# Patient Record
Sex: Female | Born: 1955 | Race: White | Hispanic: No | Marital: Married | Smoking: Never smoker
Health system: Southern US, Community
[De-identification: ages and names within clinical notes are randomized; demographics above are authoritative.]

## PROBLEM LIST (undated history)

## (undated) DIAGNOSIS — M542 Cervicalgia: Secondary | ICD-10-CM

## (undated) DIAGNOSIS — F32A Depression, unspecified: Secondary | ICD-10-CM

## (undated) DIAGNOSIS — M4800 Spinal stenosis, site unspecified: Secondary | ICD-10-CM

## (undated) DIAGNOSIS — R87629 Unspecified abnormal cytological findings in specimens from vagina: Secondary | ICD-10-CM

## (undated) DIAGNOSIS — G8929 Other chronic pain: Secondary | ICD-10-CM

## (undated) DIAGNOSIS — N200 Calculus of kidney: Secondary | ICD-10-CM

## (undated) DIAGNOSIS — K635 Polyp of colon: Secondary | ICD-10-CM

## (undated) DIAGNOSIS — M810 Age-related osteoporosis without current pathological fracture: Secondary | ICD-10-CM

## (undated) DIAGNOSIS — I5189 Other ill-defined heart diseases: Secondary | ICD-10-CM

## (undated) DIAGNOSIS — K219 Gastro-esophageal reflux disease without esophagitis: Secondary | ICD-10-CM

## (undated) DIAGNOSIS — I10 Essential (primary) hypertension: Secondary | ICD-10-CM

## (undated) DIAGNOSIS — R0600 Dyspnea, unspecified: Secondary | ICD-10-CM

## (undated) DIAGNOSIS — G43909 Migraine, unspecified, not intractable, without status migrainosus: Secondary | ICD-10-CM

## (undated) DIAGNOSIS — T7840XA Allergy, unspecified, initial encounter: Secondary | ICD-10-CM

## (undated) HISTORY — DX: Migraine, unspecified, not intractable, without status migrainosus: G43.909

## (undated) HISTORY — PX: BACK SURGERY: SHX140

## (undated) HISTORY — DX: Calculus of kidney: N20.0

## (undated) HISTORY — DX: Other chronic pain: G89.29

## (undated) HISTORY — PX: VAGINAL HYSTERECTOMY: SUR661

## (undated) HISTORY — DX: Spinal stenosis, site unspecified: M48.00

## (undated) HISTORY — DX: Polyp of colon: K63.5

## (undated) HISTORY — DX: Unspecified abnormal cytological findings in specimens from vagina: R87.629

## (undated) HISTORY — DX: Allergy, unspecified, initial encounter: T78.40XA

## (undated) HISTORY — DX: Cervicalgia: M54.2

## (undated) HISTORY — DX: Age-related osteoporosis without current pathological fracture: M81.0

---

## 2009-09-17 ENCOUNTER — Ambulatory Visit: Payer: Self-pay | Admitting: General Practice

## 2012-06-15 ENCOUNTER — Ambulatory Visit: Payer: Self-pay | Admitting: General Practice

## 2012-12-06 ENCOUNTER — Ambulatory Visit: Payer: Self-pay | Admitting: General Practice

## 2012-12-26 ENCOUNTER — Ambulatory Visit: Payer: Self-pay | Admitting: General Practice

## 2013-07-21 DIAGNOSIS — G5601 Carpal tunnel syndrome, right upper limb: Secondary | ICD-10-CM | POA: Insufficient documentation

## 2014-03-07 ENCOUNTER — Ambulatory Visit: Payer: Self-pay

## 2014-09-06 DIAGNOSIS — M542 Cervicalgia: Secondary | ICD-10-CM | POA: Insufficient documentation

## 2014-09-06 DIAGNOSIS — R202 Paresthesia of skin: Secondary | ICD-10-CM | POA: Insufficient documentation

## 2014-10-17 ENCOUNTER — Encounter: Payer: Self-pay | Admitting: Family Medicine

## 2014-10-17 ENCOUNTER — Ambulatory Visit (INDEPENDENT_AMBULATORY_CARE_PROVIDER_SITE_OTHER): Payer: BLUE CROSS/BLUE SHIELD | Admitting: Family Medicine

## 2014-10-17 VITALS — BP 129/75 | HR 82 | Resp 16 | Ht 64.0 in | Wt 218.6 lb

## 2014-10-17 DIAGNOSIS — M7989 Other specified soft tissue disorders: Secondary | ICD-10-CM | POA: Insufficient documentation

## 2014-10-17 DIAGNOSIS — R202 Paresthesia of skin: Secondary | ICD-10-CM | POA: Diagnosis not present

## 2014-10-17 DIAGNOSIS — B354 Tinea corporis: Secondary | ICD-10-CM | POA: Diagnosis not present

## 2014-10-17 DIAGNOSIS — N951 Menopausal and female climacteric states: Secondary | ICD-10-CM | POA: Insufficient documentation

## 2014-10-17 MED ORDER — TERBINAFINE HCL 1 % EX CREA: 1.0000 "application " | TOPICAL_CREAM | Freq: Two times a day (BID) | CUTANEOUS | Status: DC

## 2014-10-17 MED ORDER — VENLAFAXINE HCL ER 75 MG PO CP24
75.0000 mg | ORAL_CAPSULE | Freq: Every day | ORAL | Status: DC
Start: 2014-10-17 — End: 2015-02-05

## 2014-10-17 NOTE — Patient Instructions (Signed)
Prop legs as possible.  Recommend support socks.

## 2014-10-17 NOTE — Progress Notes (Signed)
Name: Robin Ochoa   MRN: [id number]    DOB: 05-Nov-1955   Date:10/17/2014       Progress Note  Subjective  Chief Complaint  Chief Complaint  Patient presents with  . Joint Swelling    Left ankle worse than right.     HPI  Hx. Of bilateral leg swelling for years.  L>R.  Somewhat worse past 2 mos.  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   Menopausal.  Robin Ochoa for "hot flashes"   Past Medical History  Diagnosis Date  . Neck pain, chronic     History  Substance Use Topics  . Smoking status: Never Smoker   . Smokeless tobacco: Never Used  . Alcohol Use: 0.6 oz/week    1 Cans of beer per week     Current outpatient prescriptions:  .  aspirin EC 81 MG tablet, Take 81 mg by mouth., Disp: , Rfl:  .  cyclobenzaprine (FLEXERIL) 10 MG tablet, Take 10 mg by mouth., Disp: , Rfl:  .  estrogens, conjugated, (PREMARIN) 0.3 MG tablet, Take 0.3 mg by mouth., Disp: , Rfl:  .  gabapentin (NEURONTIN) 100 MG capsule, Take 100 mg by mouth., Disp: , Rfl:  .  hydrochlorothiazide (HYDRODIURIL) 25 MG tablet, Take 25 mg by mouth., Disp: , Rfl:  .  Multiple Vitamins-Minerals (CENTRUM ADULTS PO), Take by mouth., Disp: , Rfl:  .  Venlafaxine HCl 150 MG TB24, Take 150 mg by mouth., Disp: , Rfl:   Allergies  Allergen Reactions  . Ibuprofen Other (See Comments)    Causes heartburn, burning in her throat.  Marland Kitchen Penicillins Rash    Review of Systems  Constitutional: Negative.  Negative for fever, chills and malaise/fatigue.  HENT: Negative.  Negative for nosebleeds.   Eyes: Negative.  Negative for blurred vision and double vision.  Respiratory: Negative.  Negative for cough, sputum production, shortness of breath and wheezing.   Cardiovascular: Negative for chest pain, palpitations, orthopnea and claudication. Leg swelling: bilateral, but L>R.  Gastrointestinal: Negative.  Negative for heartburn, nausea, vomiting, abdominal pain, diarrhea, constipation and blood in stool.  Genitourinary:  Negative.  Negative for dysuria, urgency, frequency and hematuria.  Musculoskeletal: Positive for back pain (with L. sciatica.).  Skin: Negative.  Negative for rash.  Neurological: Negative.  Negative for dizziness, tingling, sensory change, focal weakness, weakness and headaches.  Psychiatric/Behavioral: Negative.  Negative for depression. The patient is not nervous/anxious.       Objective  Filed Vitals:   10/17/14 1612  BP: 129/75  Pulse: 82  Resp: 16  Height: 5\' 4"  (1.626 m)  Weight: 218 lb 9.6 oz (99.156 kg)     Physical Exam  Constitutional: She is oriented to person, place, and time and well-developed, well-nourished, and in no distress. No distress.  HENT:  Head: Normocephalic and atraumatic.  Neck: Normal range of motion. Neck supple. No thyromegaly present.  Cardiovascular: Normal rate, regular rhythm, normal heart sounds and intact distal pulses.  Exam reveals no gallop and no friction rub.   No murmur heard. Pulmonary/Chest: Effort normal and breath sounds normal. No respiratory distress. She has no wheezes. She has no rales.  Abdominal: Soft. Bowel sounds are normal. She exhibits no mass. There is no tenderness.  Musculoskeletal: She exhibits edema (bilateral pedal edema with L>R.).  Lymphadenopathy:    She has no cervical adenopathy.  Neurological: She is alert and oriented to person, place, and time.  Skin: Skin is warm and dry.  Vitals reviewed.  Assessment & Plan    .1. Leg swelling  - Ambulatory referral to Vascular Surgery  2. Leg paresthesia -Continue current meds.  3. Tinea corporis  - terbinafine (LAMISIL AT) 1 % cream; Apply 1 application topically 2 (two) times daily.  Dispense: 30 g; Refill: 0  4. Menopausal hot flushes  - venlafaxine XR (EFFEXOR XR) 75 MG 24 hr capsule; Take 1 capsule (75 mg total) by mouth daily with breakfast.  Dispense: 30 capsule; Refill: 6

## 2014-10-19 ENCOUNTER — Telehealth: Payer: Self-pay | Admitting: Family Medicine

## 2014-10-19 NOTE — Telephone Encounter (Signed)
In referral notes.

## 2014-10-19 NOTE — Telephone Encounter (Signed)
Pt states a referral was to be made for her and she wanted the CMA to know she will be out of town June 27-29  Monday-Wednesday.

## 2014-10-31 ENCOUNTER — Telehealth: Payer: Self-pay | Admitting: Family Medicine

## 2014-10-31 NOTE — Telephone Encounter (Signed)
Pt called wanted to know  If  Referral  To a vascular  Dr.pt call back # is  [phone].

## 2014-11-01 NOTE — Telephone Encounter (Signed)
Faxed to [place] at [place] and she will contact patient.[place] patient she will hear from them. [facility]

## 2014-12-03 ENCOUNTER — Ambulatory Visit: Payer: BLUE CROSS/BLUE SHIELD | Attending: Neurology

## 2014-12-03 DIAGNOSIS — G4733 Obstructive sleep apnea (adult) (pediatric): Secondary | ICD-10-CM | POA: Insufficient documentation

## 2014-12-03 DIAGNOSIS — G4761 Periodic limb movement disorder: Secondary | ICD-10-CM | POA: Diagnosis not present

## 2015-02-05 ENCOUNTER — Ambulatory Visit (INDEPENDENT_AMBULATORY_CARE_PROVIDER_SITE_OTHER): Payer: BLUE CROSS/BLUE SHIELD | Admitting: Family Medicine

## 2015-02-05 ENCOUNTER — Encounter: Payer: Self-pay | Admitting: Family Medicine

## 2015-02-05 VITALS — BP 127/74 | HR 84 | Resp 16 | Ht 64.0 in | Wt 217.8 lb

## 2015-02-05 DIAGNOSIS — Z23 Encounter for immunization: Secondary | ICD-10-CM | POA: Diagnosis not present

## 2015-02-05 DIAGNOSIS — M25561 Pain in right knee: Secondary | ICD-10-CM | POA: Diagnosis not present

## 2015-02-05 DIAGNOSIS — M25569 Pain in unspecified knee: Secondary | ICD-10-CM | POA: Insufficient documentation

## 2015-02-05 DIAGNOSIS — M7989 Other specified soft tissue disorders: Secondary | ICD-10-CM

## 2015-02-05 DIAGNOSIS — B354 Tinea corporis: Secondary | ICD-10-CM | POA: Diagnosis not present

## 2015-02-05 DIAGNOSIS — N951 Menopausal and female climacteric states: Secondary | ICD-10-CM | POA: Diagnosis not present

## 2015-02-05 MED ORDER — KETOCONAZOLE 2 % EX CREA: 1.0000 "application " | TOPICAL_CREAM | Freq: Every day | CUTANEOUS | Status: DC

## 2015-02-05 MED ORDER — MELOXICAM 15 MG PO TABS: 15.0000 mg | ORAL_TABLET | Freq: Every day | ORAL | Status: DC

## 2015-02-05 MED ORDER — GABAPENTIN 100 MG PO CAPS: ORAL_CAPSULE | ORAL | Status: DC

## 2015-02-05 MED ORDER — VENLAFAXINE HCL 75 MG PO TABS: ORAL_TABLET | ORAL | Status: DC

## 2015-02-05 NOTE — Progress Notes (Signed)
Name: Robin Ochoa   MRN: [id number]    DOB: 07/29/1955   Date:02/05/2015       Progress Note  Subjective  Chief Complaint  Chief Complaint  Patient presents with  . Leg Swelling  . Knee Pain    Right Knee pain after turning wrong about a week ago.     HPI  Here for f/u of pedal edema. Vascular evaluation just showed slow vascular return.  Suggesed support socks as I did.  R. Knee pain after minor twist. R knee with swelling, popping, and pain now, with exercise.  Resting it helps.  Feels like it will give way and hurts more.     Also tinea corporis of L. Arm not healed yet. No problem-specific assessment & plan notes found for this encounter.   Past Medical History  Diagnosis Date  . Neck pain, chronic     Social History  Substance Use Topics  . Smoking status: Never Smoker   . Smokeless tobacco: Never Used  . Alcohol Use: 0.6 oz/week    1 Cans of beer per week     Current outpatient prescriptions:  .  aspirin EC 81 MG tablet, Take 81 mg by mouth., Disp: , Rfl:  .  cyclobenzaprine (FLEXERIL) 10 MG tablet, TAKE (1) TABLET THREE TIMES DAILY AS NEEDED FOR MUSCLE SPASM., Disp: , Rfl:  .  gabapentin (NEURONTIN) 100 MG capsule, Take 100 mg by mouth., Disp: , Rfl:  .  Multiple Vitamins-Minerals (CENTRUM ADULTS PO), Take by mouth., Disp: , Rfl:  .  terbinafine (LAMISIL) 1 % cream, Apply topically., Disp: , Rfl:  .  venlafaxine (EFFEXOR) 75 MG tablet, Take 75 mg by mouth., Disp: , Rfl:  .  estrogens, conjugated, (PREMARIN) 0.3 MG tablet, Take 0.3 mg by mouth., Disp: , Rfl:  .  gabapentin (NEURONTIN) 100 MG capsule, Take 100 mg by mouth., Disp: , Rfl:   Allergies  Allergen Reactions  . Ibuprofen Other (See Comments)    Causes heartburn, burning in her throat.  Marland Kitchen Penicillins Rash    Review of Systems  Constitutional: Negative for fever, chills, weight loss and malaise/fatigue.  HENT: Negative for hearing loss.   Eyes: Negative for blurred vision and double vision.   Respiratory: Negative for cough, hemoptysis, shortness of breath and wheezing.   Cardiovascular: Negative for chest pain, palpitations, orthopnea and leg swelling.  Gastrointestinal: Negative for heartburn, abdominal pain and blood in stool.  Genitourinary: Negative for dysuria, urgency and frequency.  Skin: Positive for rash.  Neurological: Negative for dizziness, tremors, weakness and headaches.      Objective  Filed Vitals:   02/05/15 1607  BP: 127/74  Pulse: 84  Resp: 16  Height: 5\' 4"  (1.626 m)  Weight: 217 lb 12.8 oz (98.793 kg)     Physical Exam  Constitutional: She is well-developed, well-nourished, and in no distress. No distress.  HENT:  Head: Normocephalic and atraumatic.  Neck: Normal range of motion. Neck supple. Carotid bruit is not present. No thyromegaly present.  Cardiovascular: Normal rate, regular rhythm, normal heart sounds and intact distal pulses.  Exam reveals no gallop and no friction rub.   No murmur heard. Pulmonary/Chest: Effort normal and breath sounds normal. No respiratory distress. She has no wheezes. She has no rales.  Musculoskeletal: She exhibits no edema (trace bilateral pedal edema.).  R knee with pain with flexion against weight.  Knee pops.  Minimal swelling of R knee.  Lymphadenopathy:    She has no cervical adenopathy.  Skin:  Linear tio sl. Circular skin lesion of L inner upper arm.  Not badly inflamed at present.  Vitals reviewed.     No results found for this or any previous visit (from the past 2160 hour(s)).   Assessment & Plan  1. Knee pain, acute, right  - meloxicam (MOBIC) 15 MG tablet; Take 1 tablet (15 mg total) by mouth daily.  Dispense: 30 tablet; Refill: 2 - gabapentin (NEURONTIN) 100 MG capsule; Take 2 capsules twice daily  Dispense: 120 capsule; Refill: 6  2. Menopausal hot flushes  - venlafaxine (EFFEXOR) 75 MG tablet; Take 1 capsule daily  Dispense: 30 tablet; Refill: 6  3. Leg swelling   4. Tinea  corporis  - ketoconazole (NIZORAL) 2 % cream; Apply 1 application topically daily.  Dispense: 30 g; Refill: 3  5. Need for influenza vaccination  - Flu Vaccine QUAD 36+ mos PF IM (Fluarix & Fluzone Quad PF)

## 2015-05-23 ENCOUNTER — Other Ambulatory Visit: Payer: Self-pay | Admitting: Family Medicine

## 2015-06-11 ENCOUNTER — Ambulatory Visit: Payer: BLUE CROSS/BLUE SHIELD | Admitting: Family Medicine

## 2015-06-22 ENCOUNTER — Other Ambulatory Visit: Payer: Self-pay | Admitting: Family Medicine

## 2015-07-04 ENCOUNTER — Encounter: Payer: Self-pay | Admitting: Cardiovascular Disease

## 2015-07-04 ENCOUNTER — Ambulatory Visit (INDEPENDENT_AMBULATORY_CARE_PROVIDER_SITE_OTHER): Payer: BLUE CROSS/BLUE SHIELD | Admitting: Cardiovascular Disease

## 2015-07-04 VITALS — BP 110/70 | HR 81 | Ht 64.0 in | Wt 217.1 lb

## 2015-07-04 DIAGNOSIS — I059 Rheumatic mitral valve disease, unspecified: Secondary | ICD-10-CM | POA: Insufficient documentation

## 2015-07-04 DIAGNOSIS — M7989 Other specified soft tissue disorders: Secondary | ICD-10-CM

## 2015-07-04 DIAGNOSIS — Z7189 Other specified counseling: Secondary | ICD-10-CM | POA: Diagnosis not present

## 2015-07-04 DIAGNOSIS — Z7689 Persons encountering health services in other specified circumstances: Secondary | ICD-10-CM

## 2015-07-04 MED ORDER — HYDROCHLOROTHIAZIDE 25 MG PO TABS: 25.0000 mg | ORAL_TABLET | Freq: Every day | ORAL | Status: DC | PRN

## 2015-07-04 NOTE — Patient Instructions (Signed)
You are doing well.  Please take HCTZ as needed with potassium for hand and ankle swelling  Leg swelling likely from venous insufficency, Consider compression hose   Watch the breads and carbs  No murmur appreciated, No need for echocardiogram at this time  Research CT coronary calcium score , to look for blockage $150  Please call us if you have new issues that need to be addressed before your next appt.

## 2015-07-04 NOTE — Assessment & Plan Note (Signed)
We have encouraged continued exercise, careful diet management in an effort to lose weight. 

## 2015-07-04 NOTE — Progress Notes (Signed)
Patient ID: Robin Ochoa, female    DOB: [birth year], 60 y.o.   MRN: [id number]  HPI Comments: [name] is a pleasant 60 year old woman with history of obesity, broken pelvis in 2014, previous echocardiogram in 2014 suggesting moderate mitral valve regurgitation with normal ejection fraction (read by Dr. Louisa Second), who presents for consultation for evaluation of her mitral valve disease and lower extremity edema. Patient of Dr. Luan Pulling  She reports a long-standing history of lower extremity edema Was on HCTZ for many years through age 5 and 60, was taken off the diuretic more recently out of concern for prolonged use of the diuretic and renal dysfunction. Since she stopped the HCTZ, she does report occasional days when she has severe swelling in her hands, sometimes in her ankles. Typically happens after she eats a high salt meal with lots of fluids. 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   She reports that she had chest pain approximately 12 years ago and at that time had stress test and echocardiogram which were reportedly normal. Seen by cardiology at the time.  Suffered a fall in 2014, broke her pelvis  Echocardiogram in 2014 with normal ejection fraction, mild TR, moderate MR (read by outside physician)  Family history; mother died at age 79, father died age 40 from stomach cancer  EKG on today's visit shows normal sinus rhythm with rate 77 bpm, nonspecific T-wave abnormality     Allergies  Allergen Reactions  . Ibuprofen Other (See Comments)    Causes heartburn, burning in her throat.  Marland Kitchen Penicillins Rash    Current Outpatient Prescriptions on File Prior to Visit  Medication Sig Dispense Refill  . aspirin EC 81 MG tablet Take 81 mg by mouth.    . cyclobenzaprine (FLEXERIL) 10 MG tablet TAKE (1) TABLET THREE TIMES DAILY AS NEEDED FOR MUSCLE SPASM.    Marland Kitchen ketoconazole (NIZORAL) 2 % cream Apply 1  application topically daily. 30 g 3  . meloxicam (MOBIC) 15 MG tablet Take 1 tablet (15 mg total) by mouth daily. 30 tablet 0  . Multiple Vitamins-Minerals (CENTRUM ADULTS PO) Take by mouth.    . venlafaxine (EFFEXOR) 75 MG tablet Take 1 capsule daily 30 tablet 6   No current facility-administered medications on file prior to visit.    Past Medical History  Diagnosis Date  . Neck pain, chronic     Past Surgical History  Procedure Laterality Date  . Vaginal hysterectomy  60 years old    Tumors-     Social History  reports that she has never smoked. She has never used smokeless tobacco. She reports that she drinks about 0.6 oz of alcohol per week. She reports that she does not use illicit drugs.  Family History family history includes Cancer in her father; Heart failure in her mother.    Review of Systems  Constitutional: Negative.   Respiratory: Negative.   Cardiovascular: Negative.   Gastrointestinal: Negative.   Musculoskeletal: Negative.   Neurological: Negative.   Hematological: Negative.   Psychiatric/Behavioral: Negative.     BP 110/70 mmHg  Pulse 81  Ht 5\' 4"  (1.626 m)  Wt 217 lb 1.9 oz (98.485 kg)  BMI 37.25 kg/m2  SpO2 95%  LMP  (Approximate)   Physical Exam  Constitutional: She is oriented to person, place, and time. She appears well-developed and well-nourished.  Obese  HENT:  Head: Normocephalic.  Nose: Nose normal.  Mouth/Throat: Oropharynx is clear and moist.  Eyes: Conjunctivae are normal. Pupils are equal, round, and reactive to light.  Neck: Normal range of motion. Neck supple. No JVD present.  Cardiovascular: Normal rate, regular rhythm, normal heart sounds and intact distal pulses.  Exam reveals no gallop and no friction rub.   No murmur heard. Pulmonary/Chest: Effort normal and breath sounds normal. No respiratory distress. She has no wheezes. She has no rales. She exhibits no tenderness.  Abdominal: Soft. Bowel sounds are normal. She  exhibits no distension. There is no tenderness.  Musculoskeletal: Normal range of motion. She exhibits no edema or tenderness.  Lymphadenopathy:    She has no cervical adenopathy.  Neurological: She is alert and oriented to person, place, and time. Coordination normal.  Skin: Skin is warm and dry. No rash noted. No erythema.  Psychiatric: She has a normal mood and affect. Her behavior is normal. Judgment and thought content normal.

## 2015-07-04 NOTE — Assessment & Plan Note (Signed)
Previous echocardiogram in 2014 suggesting moderate MR. No significant murmur appreciated on exam today on thorough auscultation. I suspect echocardiogram was an over call as no murmur appreciated. As she has no clinical symptoms concerning for significant mitral valve regurgitation, would not recommend echocardiogram at this time. Certainly if she is concerned about MR or develops clinical signs of fluid overload, could order an echo. She is happy to do no further workup at this time

## 2015-07-04 NOTE — Assessment & Plan Note (Signed)
Leg swelling likely secondary to venous insufficiency, exacerbated by her weight. Recommended compression hose, leg elevation. Recommended that she avoid excessive salt. Previously on HCTZ. She does report since stopping HCTZ she does have rare occasions of severe hand swelling, ankle swelling after salt loading. Recommended she take HCTZ only when necessary for severe hand or ankle swelling but not on a regular basis. Suggested she take this with low-dose potassium, over-the-counter

## 2015-07-25 ENCOUNTER — Other Ambulatory Visit: Payer: Self-pay | Admitting: Family Medicine

## 2015-08-22 DIAGNOSIS — B372 Candidiasis of skin and nail: Secondary | ICD-10-CM | POA: Diagnosis not present

## 2015-08-22 DIAGNOSIS — G4733 Obstructive sleep apnea (adult) (pediatric): Secondary | ICD-10-CM | POA: Diagnosis not present

## 2015-08-22 DIAGNOSIS — L920 Granuloma annulare: Secondary | ICD-10-CM | POA: Diagnosis not present

## 2015-10-01 DIAGNOSIS — Z0000 Encounter for general adult medical examination without abnormal findings: Secondary | ICD-10-CM | POA: Diagnosis not present

## 2015-10-01 DIAGNOSIS — N879 Dysplasia of cervix uteri, unspecified: Secondary | ICD-10-CM | POA: Diagnosis not present

## 2015-10-01 DIAGNOSIS — Z780 Asymptomatic menopausal state: Secondary | ICD-10-CM | POA: Diagnosis not present

## 2015-10-01 DIAGNOSIS — Z6836 Body mass index (BMI) 36.0-36.9, adult: Secondary | ICD-10-CM | POA: Diagnosis not present

## 2015-10-18 ENCOUNTER — Other Ambulatory Visit: Payer: Self-pay | Admitting: Family Medicine

## 2015-10-18 DIAGNOSIS — N951 Menopausal and female climacteric states: Secondary | ICD-10-CM

## 2015-10-21 MED ORDER — VENLAFAXINE HCL 75 MG PO TABS: ORAL_TABLET | ORAL | Status: DC

## 2015-11-20 ENCOUNTER — Ambulatory Visit (INDEPENDENT_AMBULATORY_CARE_PROVIDER_SITE_OTHER): Payer: BLUE CROSS/BLUE SHIELD | Admitting: Family Medicine

## 2015-11-20 ENCOUNTER — Telehealth: Payer: Self-pay | Admitting: Family Medicine

## 2015-11-20 ENCOUNTER — Encounter: Payer: Self-pay | Admitting: Family Medicine

## 2015-11-20 VITALS — BP 131/66 | HR 73 | Temp 98.5°F | Resp 16 | Ht 64.0 in | Wt 216.0 lb

## 2015-11-20 DIAGNOSIS — M25561 Pain in right knee: Secondary | ICD-10-CM

## 2015-11-20 DIAGNOSIS — Z23 Encounter for immunization: Secondary | ICD-10-CM | POA: Diagnosis not present

## 2015-11-20 DIAGNOSIS — Z8249 Family history of ischemic heart disease and other diseases of the circulatory system: Secondary | ICD-10-CM | POA: Diagnosis not present

## 2015-11-20 DIAGNOSIS — N951 Menopausal and female climacteric states: Secondary | ICD-10-CM

## 2015-11-20 DIAGNOSIS — R635 Abnormal weight gain: Secondary | ICD-10-CM | POA: Diagnosis not present

## 2015-11-20 MED ORDER — VENLAFAXINE HCL 75 MG PO TABS: ORAL_TABLET | ORAL | 11 refills | Status: DC

## 2015-11-20 MED ORDER — GABAPENTIN 100 MG PO CAPS: 200.0000 mg | ORAL_CAPSULE | Freq: Every day | ORAL | 11 refills | Status: DC

## 2015-11-20 MED ORDER — MELOXICAM 7.5 MG PO TABS: ORAL_TABLET | ORAL | 5 refills | Status: DC

## 2015-11-20 NOTE — Assessment & Plan Note (Signed)
Renewed gabapentin and effexor. Recommend taking it at night-time to help with symptoms. Recheck 6 mos.

## 2015-11-20 NOTE — Progress Notes (Signed)
Subjective:    Patient ID: Robin Ochoa, female    DOB: [birth year], 60 y.o.   MRN: [id number]  HPI: Robin Ochoa is a 60 y.o. female presenting on 11/20/2015 for Medication Refill   HPI  Pt presents for medication refill.  Needs refill for  Gabapentin and Effexor for hot flashes. Taking 2 capsules of gabapentin in the AM. Taking effexor 75mg  once daily. Pt also requesting refill on hydroxyzine for occasional migraines. 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 Previously seeing HA specialist in [place]. Triggered by the heat. Last migraine was last weekend. No focal neurological symptoms.  Pt is also requesting a refill of meloxicam for joint pain.  She would also like lab-work. She is concerned about weight gain. Having trouble losing weight. Is trying to cut out sodas and juice.  Would like the shingles vaccine.   Past Medical History:  Diagnosis Date  . Migraine   . Neck pain, chronic     Current Outpatient Prescriptions on File Prior to Visit  Medication Sig  . aspirin EC 81 MG tablet Take 81 mg by mouth.  . cyclobenzaprine (FLEXERIL) 10 MG tablet TAKE (1) TABLET THREE TIMES DAILY AS NEEDED FOR MUSCLE SPASM.  Marland Kitchen hydrochlorothiazide (HYDRODIURIL) 25 MG tablet Take 1 tablet (25 mg total) by mouth daily as needed.  . hydrOXYzine (ATARAX/VISTARIL) 25 MG tablet Take 25-50 mg by mouth 2 (two) times daily as needed for anxiety (& headaches).  . Multiple Vitamins-Minerals (CENTRUM ADULTS PO) Take by mouth.  . gabapentin (NEURONTIN) 100 MG capsule Take 100 mg by mouth.   No current facility-administered medications on file prior to visit.     Review of Systems  Constitutional: Positive for unexpected weight change. Negative for chills and fever.  HENT: Negative.   Respiratory: Negative for cough, chest tightness and wheezing.   Cardiovascular: Negative for chest pain and leg swelling.  Gastrointestinal: Negative for abdominal pain, constipation, diarrhea, nausea and vomiting.  Endocrine: Negative.   Negative for cold intolerance, heat intolerance, polydipsia, polyphagia and polyuria.  Genitourinary: Negative for difficulty urinating and dysuria.  Musculoskeletal: Positive for arthralgias.  Neurological: Negative for dizziness, light-headedness and numbness.  Psychiatric/Behavioral: Negative.    Per HPI unless specifically indicated above     Objective:    BP 131/66 (BP Location: Right Arm, Patient Position: Sitting, Cuff Size: Normal)   Pulse 73   Temp 98.5 F (36.9 C) (Oral)   Resp 16   Ht 5\' 4"  (1.626 m)   Wt 216 lb (98 kg)   LMP  (Approximate)   BMI 37.08 kg/m   Wt Readings from Last 3 Encounters:  11/20/15 216 lb (98 kg)  07/04/15 217 lb 1.9 oz (98.5 kg)  02/05/15 217 lb 12.8 oz (98.8 kg)    Physical Exam  Constitutional: She is oriented to person, place, and time. She appears well-developed and well-nourished.  HENT:  Head: Normocephalic and atraumatic.  Neck: Neck supple.  Cardiovascular: Normal rate, regular rhythm and normal heart sounds.  Exam reveals no gallop and no friction rub.   No murmur heard. Pulmonary/Chest: Effort normal and breath sounds normal. She has no wheezes. She exhibits no tenderness.  Abdominal: Soft. Normal appearance and bowel sounds are normal. She exhibits no distension and no mass. There is no tenderness. There is no rebound and no guarding.  Musculoskeletal: Normal range of motion. She exhibits no edema or tenderness.  Lymphadenopathy:    She has no cervical adenopathy.  Neurological: She is alert and oriented  to person, place, and time.  Skin: Skin is warm and dry.   No results found for this or any previous visit.    Assessment & Plan:   Problem List Items Addressed This Visit      Other   Menopausal hot flushes    Renewed gabapentin and effexor. Recommend taking it at night-time to help with symptoms. Recheck 6 mos.       Relevant Medications   venlafaxine (EFFEXOR) 75 MG tablet   gabapentin (NEURONTIN) 100 MG capsule    Other Relevant Orders   COMPLETE METABOLIC PANEL WITH GFR   VITAMIN D 25 Hydroxy (Vit-D Deficiency, Fractures)   Knee pain, acute - Primary    Renewed meloxicam for joint pain. Reviewed risks of longterm NSAIDs with patient. Will try lower dose and taking PRN.  Check CMP.       Relevant Medications   meloxicam (MOBIC) 7.5 MG tablet   Morbid obesity ([place])    Reviewed healthy living tips. Encouraged eliminating sodas. Encouraged 150 minutes of exercise weekly.        Other Visit Diagnoses    Family history of heart disease       check lipid panel.    Relevant Orders   Lipid Profile   Abnormal weight gain       Chest TSH    Relevant Orders   TSH   Need for Zostavax administration       Relevant Orders   Varicella-zoster vaccine subcutaneous      Meds ordered this encounter  Medications  . venlafaxine (EFFEXOR) 75 MG tablet    Sig: Take 1 capsule daily    Dispense:  30 tablet    Refill:  11    Order Specific Question:   Supervising Provider    Answer:   Arlis Porta [phone]  . gabapentin (NEURONTIN) 100 MG capsule    Sig: Take 2 capsules (200 mg total) by mouth at bedtime.    Dispense:  60 capsule    Refill:  11    Order Specific Question:   Supervising Provider    Answer:   Arlis Porta [phone]  . meloxicam (MOBIC) 7.5 MG tablet    Sig: Take 1-2 tablets by mouth as needed for joint pain.    Dispense:  60 tablet    Refill:  5    Order Specific Question:   Supervising Provider    Answer:   Arlis Porta [id number]      Follow up plan: Return in about 6 months (around 05/22/2016), or if symptoms worsen or fail to improve.

## 2015-11-20 NOTE — Telephone Encounter (Signed)
Pt needs a refill on venlafaxine time released 75 mg sent to [facility].  Last time the time released was not sent so please make sure that is what is sent in.  Her call back number is [phone]

## 2015-11-20 NOTE — Assessment & Plan Note (Signed)
Reviewed healthy living tips. Encouraged eliminating sodas. Encouraged 150 minutes of exercise weekly.

## 2015-11-20 NOTE — Telephone Encounter (Signed)
Pt advised to schedule an appointment for med refill hasn't been seen from 01/2015.

## 2015-11-20 NOTE — Assessment & Plan Note (Signed)
Renewed meloxicam for joint pain. Reviewed risks of longterm NSAIDs with patient. Will try lower dose and taking PRN.  Check CMP.

## 2015-11-20 NOTE — Patient Instructions (Signed)
Joint pain: try taking 1 7.5mg  tablet to help with your symptoms. Can take 2 tablets daily as needed for pain.  Hot flashes- Try taking gabapentin at night-time to help with symptoms.  Weightloss: We will check some lab work. Please try to meet the goal of 150 minutes of exercise per week.  This is generally 30-40 minutes of moderate activity 3-4 times per week. Consider a calorie goal of 1800 per [place]. Eat mainly fruits, vegetables, and lean proteins (chicken or fish).  A great resource for healthy living and weight loss is Choosemyplate.gov

## 2015-11-21 LAB — COMPLETE METABOLIC PANEL WITH GFR
ALBUMIN: 4.1 g/dL (ref 3.6–5.1)
ALK PHOS: 86 U/L (ref 33–130)
ALT: 27 U/L (ref 6–29)
AST: 22 U/L (ref 10–35)
BILIRUBIN TOTAL: 0.3 mg/dL (ref 0.2–1.2)
BUN: 23 mg/dL (ref 7–25)
CALCIUM: 9.6 mg/dL (ref 8.6–10.4)
CO2: 25 mmol/L (ref 20–31)
Chloride: 104 mmol/L (ref 98–110)
Creat: 0.88 mg/dL (ref 0.50–0.99)
GFR, EST NON AFRICAN AMERICAN: 71 mL/min (ref >=60)
GFR, Est African American: 81 mL/min (ref >=60)
GLUCOSE: 116 mg/dL — AB (ref 65–99)
POTASSIUM: 3.8 mmol/L (ref 3.5–5.3)
SODIUM: 139 mmol/L (ref 135–146)
TOTAL PROTEIN: 6.8 g/dL (ref 6.1–8.1)

## 2015-11-21 LAB — LIPID PANEL
CHOLESTEROL: 197 mg/dL (ref 125–200)
HDL: 41 mg/dL — AB (ref >=46)
Total CHOL/HDL Ratio: 4.8 Ratio (ref <=5.0)
Triglycerides: 504 mg/dL — ABNORMAL HIGH (ref <150)

## 2015-11-21 LAB — VITAMIN D 25 HYDROXY (VIT D DEFICIENCY, FRACTURES): VIT D 25 HYDROXY: 21 ng/mL — AB (ref 30–100)

## 2015-11-21 LAB — TSH: TSH: 2.99 m[IU]/L

## 2015-11-26 ENCOUNTER — Other Ambulatory Visit: Payer: Self-pay | Admitting: Family Medicine

## 2015-11-26 DIAGNOSIS — E782 Mixed hyperlipidemia: Secondary | ICD-10-CM

## 2015-11-26 MED ORDER — VITAMIN D (ERGOCALCIFEROL) 1.25 MG (50000 UNIT) PO CAPS: 50000.0000 [IU] | ORAL_CAPSULE | ORAL | 1 refills | Status: DC

## 2016-01-13 DIAGNOSIS — G4733 Obstructive sleep apnea (adult) (pediatric): Secondary | ICD-10-CM | POA: Diagnosis not present

## 2016-01-22 ENCOUNTER — Other Ambulatory Visit: Payer: Self-pay | Admitting: Family Medicine

## 2016-02-06 DIAGNOSIS — Z23 Encounter for immunization: Secondary | ICD-10-CM | POA: Diagnosis not present

## 2016-03-09 ENCOUNTER — Ambulatory Visit: Payer: BLUE CROSS/BLUE SHIELD | Admitting: Family Medicine

## 2016-03-24 ENCOUNTER — Ambulatory Visit: Payer: BLUE CROSS/BLUE SHIELD | Admitting: Family Medicine

## 2016-03-26 ENCOUNTER — Ambulatory Visit (INDEPENDENT_AMBULATORY_CARE_PROVIDER_SITE_OTHER): Payer: BLUE CROSS/BLUE SHIELD | Admitting: Family Medicine

## 2016-03-26 ENCOUNTER — Ambulatory Visit
Admission: RE | Admit: 2016-03-26 | Discharge: 2016-03-26 | Disposition: A | Discharge status: Home / Self Care | Payer: BLUE CROSS/BLUE SHIELD | Source: Ambulatory Visit | Attending: Family Medicine | Admitting: Family Medicine

## 2016-03-26 ENCOUNTER — Encounter: Payer: Self-pay | Admitting: Family Medicine

## 2016-03-26 VITALS — BP 126/65 | HR 67 | Temp 98.3°F | Resp 16 | Ht 64.0 in | Wt 211.6 lb

## 2016-03-26 DIAGNOSIS — R202 Paresthesia of skin: Secondary | ICD-10-CM

## 2016-03-26 DIAGNOSIS — G8929 Other chronic pain: Secondary | ICD-10-CM | POA: Diagnosis not present

## 2016-03-26 DIAGNOSIS — R7309 Other abnormal glucose: Secondary | ICD-10-CM | POA: Diagnosis not present

## 2016-03-26 DIAGNOSIS — M5441 Lumbago with sciatica, right side: Principal | ICD-10-CM

## 2016-03-26 DIAGNOSIS — M47896 Other spondylosis, lumbar region: Secondary | ICD-10-CM | POA: Insufficient documentation

## 2016-03-26 DIAGNOSIS — M5442 Lumbago with sciatica, left side: Secondary | ICD-10-CM

## 2016-03-26 DIAGNOSIS — E782 Mixed hyperlipidemia: Secondary | ICD-10-CM

## 2016-03-26 DIAGNOSIS — M545 Low back pain: Secondary | ICD-10-CM | POA: Diagnosis not present

## 2016-03-26 NOTE — Patient Instructions (Signed)
Thank you for coming in to clinic today.  1. For your Back Pain - I think that this is due to Muscle Spasms or strain. Your Sciatic Nerve can be affected causing some of your radiation and numbness down your legs. 2. Meloxicam 15mg  daily with food 3. Start Cyclobenzapine (Flexeril) 10mg  tablets -take half to a whole up to 3 times a [place] as needed  Start Gabapentin 100mg  capsules, take at night for 2-3 nights only, and then increase to 2 times a [place] for a few days, and then may increase to 3 times a Devora, it may make you drowsy, if helps significantly at night only, then you can increase instead to 3 capsules at night, instead of 3 times a [place] - In the future if needed, we can significantly increase the dose if tolerated well, some common doses are 300mg  three times a Feick  4. May use Tylenol Extra Str 500mg  tabs - may take 1-2 tablets every 6 hours as needed  5. Recommend to start using heating pad on your lower back 1-2x daily for few weeks, can continue ice  A1c Blood test today for diabetes screening  Diet Recommendations for Low Carb   Avoid Starchy (carb) foods include: Bread, rice, pasta, potatoes, corn, crackers, bagels, muffins, all baked goods.   Protein foods include: Meat, fish, poultry, eggs, dairy foods, and beans such as pinto and kidney beans (beans also provide carbohydrate).   1. Eat at least 3 meals and 1-2 snacks per Anastos. Never go more than 4-5 hours while awake without eating.   2. Limit starchy foods to TWO per meal and ONE per snack. ONE portion of a starchy  food is equal to the following:   - ONE slice of bread (or its equivalent, such as half of a hamburger bun).   - 1/2 cup of a "scoopable" starchy food such as potatoes or rice.   - 1 OUNCE (28 grams) of starchy snacks (crackers or pretzels, look on label).   - 15 grams of carbohydrate as shown on food label.   3. Both lunch and dinner should include a protein food, a carb food, and vegetables.   - Obtain twice as  many veg's as protein or carbohydrate foods for both lunch and dinner.   - Try to keep frozen veg's on hand for a quick vegetable serving.     - Fresh or frozen veg's are best.   4. Breakfast should always include protein.    Fish oil over the counter 1g 3 times a Dalby  Vitamin D3, 2,000 units daily   This pain may take weeks to months to fully resolve, but hopefully it will respond to the medicine initially. All back injuries (small or serious) are slow to heal since we use our back muscles every Roots. Be careful with turning, twisting, lifting, sitting / standing for prolonged periods, and avoid re-injury.  If your symptoms significantly worsen with more pain, or new symptoms with weakness in one or both legs, new or different shooting leg pains, numbness in legs or groin, loss of control or retention of urine or bowel movements, please call back for advice and you may need to go directly to the Emergency Department.  Please schedule a follow-up appointment with Dr. Parks Ranger in 4 weeks for Low Back Pain, sooner if worsening  If you have any other questions or concerns, please feel free to call the clinic or send a message through [place]. You may also schedule an earlier  appointment if necessary.  [name], DO [place]

## 2016-03-26 NOTE — Progress Notes (Signed)
Subjective:    Patient ID: Robin Ochoa, female    DOB: October 03, 1955, 60 y.o.   MRN: [id number]  Robin Ochoa is a 60 y.o. female presenting on 03/26/2016 for Medication Refill (follow up)   HPI   LOW BACK PAIN, Subacute/Chronic: - Today presents with 2 months gradual worsening without improvement mid to Right low back pain, associated with shooting sharp radicular pain down back of both lower legs, left occasionally will get left leg numbness, worst with prolonged standing. Improved with sitting and resting, worse stiffness in morning. 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 Chronic history >5 yr ago had fall and fractured pelvis. - Improved with ice packs temporarily. Not tried heat - Taking Tylenol 500mg  x 2 tabs once daily (with moderate relief), Meloxicam 15mg  with food doing well, couldn't tolerate ibuprofen - No known history of lumbar OA/DJD, no prior back surgery - Admits difficulty sleeping due to pain - Denies any fevers/chills, numbness, tingling, weakness, loss of control bladder/bowel incontinence or retention, unintentional wt loss, night sweats  ABNORMAL GLUCOSE / HyperTG mixed HLD - Requests to review recent lab results from 10/2015, with abnormal non fasting glucose 116, abnormal TG up to 504, low HDL 41, and unable to calc LDL - No prior diagnosis of Pre-DM or DM. No family history of DM - Not taking any new medications for these problems, advised to follow-up re-check labs  Additional PM - C-spine DJD, was followed by [facility] in 05/2014, PM&R for Cervicalgia and some radicular symptoms, improved with chiropractor therapy, and Flexeril now out of this.  - Postmenopausal Hot flashes, currently managed on Effexor 75mg  daily and Gabapentin 100mg  once in morning. Not due for any refills today.   Social History  Substance Use Topics  . Smoking status: Never Smoker  . Smokeless tobacco: Never Used  . Alcohol  use 0.6 oz/week    1 Cans of beer per week    Review of Systems Per HPI unless specifically indicated above     Objective:    BP 126/65   Pulse 67   Temp 98.3 F (36.8 C) (Oral)   Resp 16   Ht 5\' 4"  (1.626 m)   Wt 211 lb 9.6 oz (96 kg)   LMP  (Approximate)   BMI 36.32 kg/m   Wt Readings from Last 3 Encounters:  03/26/16 211 lb 9.6 oz (96 kg)  11/20/15 216 lb (98 kg)  07/04/15 217 lb 1.9 oz (98.5 kg)    Physical Exam  Constitutional: She appears well-developed and well-nourished. No distress.  Well-appearing, comfortable, cooperative  HENT:  Head: Normocephalic and atraumatic.  Mouth/Throat: Oropharynx is clear and moist.  Cardiovascular: Normal rate and intact distal pulses.   Pulmonary/Chest: Effort normal.  Musculoskeletal: She exhibits no edema.  Low Back Inspection: Normal appearance, no spinal deformity, symmetrical. Palpation: No tenderness over spinous processes. Bilateral lumbar paraspinal muscles non-tender but localized tenderness right sided SI / piriformis region with some hypertonicity and spasm. ROM: Mostly intact full active ROM forward flex / back extension Special Testing: Seated SLR negative for radicular pain bilaterally, Right with low back pulling and tightness Strength: Bilateral hip flex/ext 5/5, knee flex/ext 5/5, ankle dorsiflex/plantarflex 5/5 Neurovascular: intact distal sensation to light touch  Neurological: She is alert. Coordination normal.  Bilateral patellar and achilles +1 DTR Distal sensation intact to light touch Normal gait  Skin: Skin is warm and dry. She is not diaphoretic.  Nursing note and vitals reviewed.    I have personally reviewed the images and agree with radiology report from Lumbar X-ray on 03/26/16.  CLINICAL DATA:  Low back pain for 2 months, no known injury, initial encounter  EXAM: LUMBAR SPINE - COMPLETE 4+ VIEW  COMPARISON:  None.  FINDINGS: Six non rib-bearing lumbar vertebra are noted. Vertebral  body height is well maintained. No pars defects are seen. Mild anterolisthesis of what appears to be L4 on L5 is noted of a degenerative nature. No soft tissue abnormality is seen.  IMPRESSION: Mild degenerative changes as described.  Electronically Signed   By: Inez Catalina M.D.   On: 03/26/2016 14:11    I have personally reviewed the following lab results from 10/2015.   Results for orders placed or performed in visit on 11/20/15  COMPLETE METABOLIC PANEL WITH GFR  Result Value Ref Range   Sodium 139 135 - 146 mmol/L   Potassium 3.8 3.5 - 5.3 mmol/L   Chloride 104 98 - 110 mmol/L   CO2 25 20 - 31 mmol/L   Glucose, Bld 116 (H) 65 - 99 mg/dL   BUN 23 7 - 25 mg/dL   Creat 0.88 0.50 - 0.99 mg/dL   Total Bilirubin 0.3 0.2 - 1.2 mg/dL   Alkaline Phosphatase 86 33 - 130 U/L   AST 22 10 - 35 U/L   ALT 27 6 - 29 U/L   Total Protein 6.8 6.1 - 8.1 g/dL   Albumin 4.1 3.6 - 5.1 g/dL   Calcium 9.6 8.6 - 10.4 mg/dL   GFR, Est African American 81 >=60 mL/min   GFR, Est Non African American 71 >=60 mL/min  Lipid Profile  Result Value Ref Range   Cholesterol 197 125 - 200 mg/dL   Triglycerides 504 (H) <150 mg/dL   HDL 41 (L) >=46 mg/dL   Total CHOL/HDL Ratio 4.8 <=5.0 Ratio   VLDL NOT CALC <30 mg/dL   LDL Cholesterol NOT CALC <130 mg/dL  VITAMIN D 25 Hydroxy (Vit-D Deficiency, Fractures)  Result Value Ref Range   Vit D, 25-Hydroxy 21 (L) 30 - 100 ng/mL  TSH  Result Value Ref Range   TSH 2.99 mIU/L      Assessment & Plan:   Problem List Items Addressed This Visit    Mixed hyperlipidemia    Significantly elevated TG >500, low HDL, unable to calc LDL This was not focus of visit today, has never taken statin - Wait on A1c results - Continue improved lifestyle modifications low carb low sugar diet, exercise, handout given - Start OTC Fish oil 1g TID for now - Follow-up 1-3 months re-evaluate, can repeat lipids 3 months see progress, discuss statin therapy      Leg  paresthesia   Chronic bilateral low back pain with bilateral sciatica - Primary    Subacute / Chronic R LBP with associated bilateral sciatica. Suspect likely due to muscle spasm/strain without known injury or trauma, without prior DJD or chronic back pain. No prior surgery - No red flag symptoms. Negative SLR for radiculopathy but concern with reported paresthesias and sciatica symptoms  Plan: 1. Lumbar X-ray today - reviewed results and images, mild DJD changes with L4 on L5 anterolisthesis. No acute findings. 2. Continue Meloxicam 15mg  daily 3. Continue Flexeril, request refill when need, 5-10mg  TID PRN 4. Increase gabapentin to 100mg  TID slow titration caution sedation, may continue increase up towards 200 to 300mg  TID if tolerated and helping a lot, notify us for refill  or higher dose if needed 5. Considered prednisone burst but hold given abnormal glucose 6. May use Tylenol PRN for breakthrough 7. Encouraged use of heating pad 1-2x daily for now then PRN 8. RTC 1 month, re-evaluation. Consider PT trial. Return criteria given if acute worsening when to go to ED, and consider patient establish back with her [facility] / PM&R or chiropractor       Relevant Orders   DG Lumbar Spine Complete (Completed)   Abnormal glucose    Check Hgb A1c today, given prior abnormal non fasting glucose      Relevant Orders   Hemoglobin A1c      No orders of the defined types were placed in this encounter.     Follow up plan: Return in about 4 weeks (around 04/23/2016), or if symptoms worsen or fail to improve, for low back pain.  [name], DO [facility] 03/26/2016, 3:58 PM

## 2016-03-26 NOTE — Assessment & Plan Note (Signed)
Significantly elevated TG >500, low HDL, unable to calc LDL This was not focus of visit today, has never taken statin - Wait on A1c results - Continue improved lifestyle modifications low carb low sugar diet, exercise, handout given - Start OTC Fish oil 1g TID for now - Follow-up 1-3 months re-evaluate, can repeat lipids 3 months see progress, discuss statin therapy

## 2016-03-26 NOTE — Assessment & Plan Note (Signed)
Check Hgb A1c today, given prior abnormal non fasting glucose

## 2016-03-26 NOTE — Assessment & Plan Note (Signed)
Subacute / Chronic R LBP with associated bilateral sciatica. Suspect likely due to muscle spasm/strain without known injury or trauma, without prior DJD or chronic back pain. No prior surgery - No red flag symptoms. Negative SLR for radiculopathy but concern with reported paresthesias and sciatica symptoms  Plan: 1. Lumbar X-ray today - reviewed results and images, mild DJD changes with L4 on L5 anterolisthesis. No acute findings. 2. Continue Meloxicam 15mg  daily 3. Continue Flexeril, request refill when need, 5-10mg  TID PRN 4. Increase gabapentin to 100mg  TID slow titration caution sedation, may continue increase up towards 200 to 300mg  TID if tolerated and helping a lot, notify us for refill or higher dose if needed 5. Considered prednisone burst but hold given abnormal glucose 6. May use Tylenol PRN for breakthrough 7. Encouraged use of heating pad 1-2x daily for now then PRN 8. RTC 1 month, re-evaluation. Consider PT trial. Return criteria given if acute worsening when to go to ED, and consider patient establish back with her [facility] Neurosurgery / PM&R or chiropractor

## 2016-03-27 LAB — HEMOGLOBIN A1C
Hgb A1c MFr Bld: 5.6 % (ref <5.7)
Mean Plasma Glucose: 114 mg/dL

## 2016-05-06 ENCOUNTER — Ambulatory Visit: Payer: BLUE CROSS/BLUE SHIELD | Admitting: Family Medicine

## 2016-07-17 ENCOUNTER — Telehealth: Payer: Self-pay

## 2016-07-17 DIAGNOSIS — M5441 Lumbago with sciatica, right side: Principal | ICD-10-CM

## 2016-07-17 DIAGNOSIS — G8929 Other chronic pain: Secondary | ICD-10-CM

## 2016-07-17 DIAGNOSIS — M5442 Lumbago with sciatica, left side: Principal | ICD-10-CM

## 2016-07-17 MED ORDER — CYCLOBENZAPRINE HCL 10 MG PO TABS: 5.0000 mg | ORAL_TABLET | Freq: Three times a day (TID) | ORAL | 2 refills | Status: DC | PRN

## 2016-07-17 NOTE — Telephone Encounter (Signed)
Saw in 02/2016, we did discuss prior rx Flexeril that was beneficial to her for back pain, and did not need at that time. Will send refill now to her pharmacy [facility].  [name], [facility] 07/17/2016, 9:32 AM

## 2016-07-17 NOTE — Telephone Encounter (Signed)
Patient called stating at last appointment that you told her that you would refill her Cyclobenzaprine when she is in need of it.  She would like this sent to [facility].

## 2016-09-12 DIAGNOSIS — M25411 Effusion, right shoulder: Secondary | ICD-10-CM | POA: Diagnosis not present

## 2016-09-12 DIAGNOSIS — K219 Gastro-esophageal reflux disease without esophagitis: Secondary | ICD-10-CM | POA: Diagnosis not present

## 2016-09-12 DIAGNOSIS — G4730 Sleep apnea, unspecified: Secondary | ICD-10-CM | POA: Diagnosis not present

## 2016-09-12 DIAGNOSIS — L539 Erythematous condition, unspecified: Secondary | ICD-10-CM | POA: Diagnosis not present

## 2016-09-12 DIAGNOSIS — M791 Myalgia: Secondary | ICD-10-CM | POA: Diagnosis not present

## 2016-09-12 DIAGNOSIS — S40261A Insect bite (nonvenomous) of right shoulder, initial encounter: Secondary | ICD-10-CM | POA: Diagnosis not present

## 2016-10-06 DIAGNOSIS — R87629 Unspecified abnormal cytological findings in specimens from vagina: Secondary | ICD-10-CM | POA: Diagnosis not present

## 2016-10-06 DIAGNOSIS — Z6835 Body mass index (BMI) 35.0-35.9, adult: Secondary | ICD-10-CM | POA: Diagnosis not present

## 2016-10-06 DIAGNOSIS — Z01419 Encounter for gynecological examination (general) (routine) without abnormal findings: Secondary | ICD-10-CM | POA: Diagnosis not present

## 2016-10-06 DIAGNOSIS — M549 Dorsalgia, unspecified: Secondary | ICD-10-CM | POA: Diagnosis not present

## 2016-10-06 DIAGNOSIS — R928 Other abnormal and inconclusive findings on diagnostic imaging of breast: Secondary | ICD-10-CM | POA: Diagnosis not present

## 2016-10-06 DIAGNOSIS — Z780 Asymptomatic menopausal state: Secondary | ICD-10-CM | POA: Diagnosis not present

## 2017-03-10 ENCOUNTER — Other Ambulatory Visit: Payer: Self-pay | Admitting: Cardiovascular Disease

## 2017-03-14 ENCOUNTER — Other Ambulatory Visit: Payer: Self-pay | Admitting: Cardiovascular Disease

## 2017-03-14 DIAGNOSIS — Z23 Encounter for immunization: Secondary | ICD-10-CM | POA: Diagnosis not present

## 2017-03-23 ENCOUNTER — Telehealth: Payer: Self-pay | Admitting: Cardiovascular Disease

## 2017-03-23 NOTE — Telephone Encounter (Signed)
Pt has only seen Dr. Rockey Situ once, in March 2017. She will need appt w/ him to discuss any testing.

## 2017-03-23 NOTE — Telephone Encounter (Signed)
°*  STAT* If patient is at the pharmacy, call can be transferred to refill team.   1. Which medications need to be refilled? (please list name of each medication and dose if known) hydrochlorothiazide 25 MG  2. Which pharmacy/location (including street and city if local pharmacy) is medication to be sent to? [facility] in [place]   3. Do they need a [address] or [address] supply? [address]  Please call to confirm when sent

## 2017-03-23 NOTE — Telephone Encounter (Signed)
Last time patient saw Dr Rockey Situ he mentioned if she was ready they could set up an appt for a picture of the heart She would like more information on this Please call to discuss

## 2017-03-24 NOTE — Telephone Encounter (Signed)
LMOM to contact our office for an appointment. The patient was last seen in 2017 with Dr. Rockey Situ.

## 2017-03-30 ENCOUNTER — Encounter: Payer: Self-pay | Admitting: Cardiovascular Disease

## 2017-03-31 ENCOUNTER — Encounter: Payer: Self-pay | Admitting: Cardiovascular Disease

## 2017-04-06 ENCOUNTER — Other Ambulatory Visit: Payer: Self-pay

## 2017-04-06 DIAGNOSIS — Z23 Encounter for immunization: Secondary | ICD-10-CM

## 2017-04-28 ENCOUNTER — Other Ambulatory Visit: Payer: Self-pay | Admitting: Neurosurgery

## 2017-04-28 DIAGNOSIS — M4716 Other spondylosis with myelopathy, lumbar region: Secondary | ICD-10-CM | POA: Diagnosis not present

## 2017-04-28 DIAGNOSIS — Z6836 Body mass index (BMI) 36.0-36.9, adult: Secondary | ICD-10-CM | POA: Diagnosis not present

## 2017-04-30 ENCOUNTER — Ambulatory Visit: Payer: BLUE CROSS/BLUE SHIELD

## 2017-05-12 DIAGNOSIS — M5106 Intervertebral disc disorders with myelopathy, lumbar region: Secondary | ICD-10-CM | POA: Diagnosis not present

## 2017-05-12 DIAGNOSIS — M4716 Other spondylosis with myelopathy, lumbar region: Secondary | ICD-10-CM | POA: Diagnosis not present

## 2017-05-12 DIAGNOSIS — Z8781 Personal history of (healed) traumatic fracture: Secondary | ICD-10-CM | POA: Diagnosis not present

## 2017-05-12 DIAGNOSIS — M48061 Spinal stenosis, lumbar region without neurogenic claudication: Secondary | ICD-10-CM | POA: Diagnosis not present

## 2017-05-12 DIAGNOSIS — M5136 Other intervertebral disc degeneration, lumbar region: Secondary | ICD-10-CM | POA: Diagnosis not present

## 2017-05-14 ENCOUNTER — Ambulatory Visit: Payer: BLUE CROSS/BLUE SHIELD | Admitting: Cardiovascular Disease

## 2017-05-26 DIAGNOSIS — Z6836 Body mass index (BMI) 36.0-36.9, adult: Secondary | ICD-10-CM | POA: Diagnosis not present

## 2017-05-26 DIAGNOSIS — M4716 Other spondylosis with myelopathy, lumbar region: Secondary | ICD-10-CM | POA: Diagnosis not present

## 2017-05-26 DIAGNOSIS — M4316 Spondylolisthesis, lumbar region: Secondary | ICD-10-CM | POA: Diagnosis not present

## 2017-06-02 ENCOUNTER — Ambulatory Visit: Payer: BLUE CROSS/BLUE SHIELD | Admitting: Cardiovascular Disease

## 2017-06-20 NOTE — Progress Notes (Signed)
Cardiology Office Note  Date:  06/22/2017   ID:  Robin Ochoa, DOB [birth year], MRN [id number]  PCP:  Karen Kitchens, MD   Chief Complaint  Patient presents with  . other    Patient is in need of refills; patient last seen 07/04/2015. Meds reviewed by the pt. verbally. Pt. c/o shortness of breath and LE edema.     HPI:  Robin Ochoa is a pleasant 62 year old woman with history of obesity,  broken pelvis in 2014,  echocardiogram in 2014 suggesting moderate mitral valve regurgitation with normal ejection fraction (read by Dr. Louisa Second),  who presents for f/u of her  mitral valve disease and lower extremity edema.   In follow-up today she reports that she is doing well Main complaint is chronic Back pain Reports that she has spinal stenosis may need surgery  Medical issues Followed by occupational health,  Reports that recent blood work was " fine" Copy has been requested  Stable lower extremity edema Would like refill of her HCTZ  on HCTZ for many years through age 64 and 6,  was taken off the diuretic previously out of concern for prolonged use of the diuretic and renal dysfunction.   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  No recent chest pain concerning for angina  EKG personally reviewed by myself on todays visit Shows normal sinus rhythm with rate 76 bpm nonspecific ST abnormality precordial leads T wave abnormality 3 and aVF  Suffered a fall in 2014, broke her pelvis  Echocardiogram in 2014 with normal ejection fraction, mild TR, moderate MR (read by outside physician)  Family history; mother died at age 2, father died age 25 from stomach cancer    PMH:   has a past medical history of Migraine, Neck pain, chronic, and Spinal stenosis.  PSH:    Past Surgical History:  Procedure Laterality Date  . VAGINAL HYSTERECTOMY  63 years  old   Tumors-     Current Outpatient Medications  Medication Sig Dispense Refill  . aspirin EC 81 MG tablet Take 81 mg by mouth.    . gabapentin (NEURONTIN) 100 MG capsule Take 100 mg by mouth.    . hydrochlorothiazide (HYDRODIURIL) 25 MG tablet Take 1 tablet (25 mg total) by mouth daily as needed. 30 tablet 6  . hydrOXYzine (ATARAX/VISTARIL) 25 MG tablet Take 25-50 mg by mouth 2 (two) times daily as needed for anxiety (& headaches).    . Multiple Vitamins-Minerals (CENTRUM ADULTS PO) Take by mouth.    Marland Kitchen tiZANidine (ZANAFLEX) 4 MG tablet Take 4 mg by mouth every 6 (six) hours as needed for muscle spasms.    . traMADol (ULTRAM) 50 MG tablet Take by mouth every 6 (six) hours as needed.    . venlafaxine (EFFEXOR) 75 MG tablet Take 1 capsule daily 30 tablet 11   No current facility-administered medications for this visit.      Allergies:   Ibuprofen and Penicillins   Social History:  The patient  reports that  has never smoked. she has never used smokeless tobacco. She reports that she drinks about 0.6 oz of alcohol per week. She reports that she does not use drugs.   Family History:   family history includes Cancer in her father; Heart failure in her mother.    Review of Systems: Review of Systems  Constitutional: Negative.   Respiratory: Negative.  Cardiovascular: Negative.   Gastrointestinal: Negative.   Musculoskeletal: Positive for back pain.  Neurological: Negative.   Psychiatric/Behavioral: Negative.   All other systems reviewed and are negative.    PHYSICAL EXAM: VS:  BP 120/72 (BP Location: Left Arm, Patient Position: Sitting, Cuff Size: Normal)   Pulse 76   Ht 5\' 3"  (1.6 m)   Wt 204 lb (92.5 kg)   BMI 36.14 kg/m  , BMI Body mass index is 36.14 kg/m. Constitutional:  oriented to person, place, and time. No distress. Obese HENT:  Head: Normocephalic and atraumatic.  Eyes:  no discharge. No scleral icterus.  Neck: Normal range of motion. Neck supple. No JVD  present.  Cardiovascular: Normal rate, regular rhythm, normal heart sounds and intact distal pulses. Exam reveals no gallop and no friction rub. No edema No murmur heard. Pulmonary/Chest: Effort normal and breath sounds normal. No stridor. No respiratory distress.  no wheezes.  no rales.  no tenderness.  Abdominal: Soft.  no distension.  no tenderness.  Musculoskeletal: Normal range of motion.  no  tenderness or deformity.  Neurological:  normal muscle tone. Coordination normal. No atrophy Skin: Skin is warm and dry. No rash noted. not diaphoretic.  Psychiatric:  normal mood and affect. behavior is normal. Thought content normal.      Recent Labs: No results found for requested labs within last 8760 hours.    Lipid Panel Lab Results  Component Value Date   CHOL 197 11/20/2015   HDL 41 (L) 11/20/2015   LDLCALC NOT CALC 11/20/2015   TRIG 504 (H) 11/20/2015      Wt Readings from Last 3 Encounters:  06/22/17 204 lb (92.5 kg)  03/26/16 211 lb 9.6 oz (96 kg)  11/20/15 216 lb (98 kg)       ASSESSMENT AND PLAN:  Mitral valve disease - Plan: EKG 12-Lead Previous mention of mitral valve disease, no significant murmur on exam, Asymptomatic, no further testing needed  Leg swelling - Plan: EKG 12-Lead Noncardiac leg swelling, dependent edema Recommended compression hose She can take HCTZ but recommended she take this sparingly Swelling in the summer likely from heat, venous issue, less likely cardiac/fluid overload All of this was discussed with her in detail  Morbid obesity due to excess calories ([place]) We have encouraged continued exercise, careful diet management in an effort to lose weight.  Essential hypertension - Plan: EKG 12-Lead  Blood pressure is well controlled on today's visit. No changes made to the medications.  Back pain/stenosis Chronic back pain, reports that she may need surgery She would be acceptable risk for surgery, no further testing  needed  Disposition:   F/U as needed   Total encounter time more than 25 minutes  Greater than 50% was spent in counseling and coordination of care with the patient    Orders Placed This Encounter  Procedures  . EKG 12-Lead     Signed, Esmond Plants, M.D., Ph.D. 06/22/2017  [place], [place]

## 2017-06-22 ENCOUNTER — Encounter: Payer: Self-pay | Admitting: Cardiovascular Disease

## 2017-06-22 ENCOUNTER — Ambulatory Visit: Payer: BLUE CROSS/BLUE SHIELD | Admitting: Cardiovascular Disease

## 2017-06-22 VITALS — BP 120/72 | HR 76 | Ht 63.0 in | Wt 204.0 lb

## 2017-06-22 DIAGNOSIS — I10 Essential (primary) hypertension: Secondary | ICD-10-CM | POA: Diagnosis not present

## 2017-06-22 DIAGNOSIS — I059 Rheumatic mitral valve disease, unspecified: Secondary | ICD-10-CM | POA: Diagnosis not present

## 2017-06-22 DIAGNOSIS — M7989 Other specified soft tissue disorders: Secondary | ICD-10-CM | POA: Diagnosis not present

## 2017-06-22 MED ORDER — HYDROCHLOROTHIAZIDE 25 MG PO TABS: 25.0000 mg | ORAL_TABLET | Freq: Every day | ORAL | 6 refills | Status: DC | PRN

## 2017-06-22 NOTE — Patient Instructions (Addendum)
Medication Instructions:   HCTZ as needed for leg swelling  Labwork:  No new labs needed  Testing/Procedures:  No further testing at this time   Follow-Up: It was a pleasure seeing you in the office today. Please call us if you have new issues that need to be addressed before your next appt.  [phone]  Your physician wants you to follow-up in: 12 months as needed.  You will receive a reminder letter in the mail two months in advance. If you don't receive a letter, please call our office to schedule the follow-up appointment.  If you need a refill on your cardiac medications before your next appointment, please call your pharmacy.  For educational health videos Log in to : [email or web address] Or : [email or web address], password : [email or web address]

## 2017-08-27 ENCOUNTER — Ambulatory Visit (INDEPENDENT_AMBULATORY_CARE_PROVIDER_SITE_OTHER): Payer: BLUE CROSS/BLUE SHIELD | Admitting: Family Medicine

## 2017-08-27 ENCOUNTER — Encounter: Payer: Self-pay | Admitting: Family Medicine

## 2017-08-27 VITALS — BP 130/70 | HR 76 | Temp 98.2°F | Resp 16 | Ht 63.0 in | Wt 203.6 lb

## 2017-08-27 DIAGNOSIS — G43909 Migraine, unspecified, not intractable, without status migrainosus: Secondary | ICD-10-CM | POA: Diagnosis not present

## 2017-08-27 DIAGNOSIS — L237 Allergic contact dermatitis due to plants, except food: Secondary | ICD-10-CM

## 2017-08-27 MED ORDER — PREDNISONE 20 MG PO TABS: ORAL_TABLET | ORAL | 0 refills | Status: DC

## 2017-08-27 MED ORDER — HYDROXYZINE HCL 25 MG PO TABS: 25.0000 mg | ORAL_TABLET | Freq: Two times a day (BID) | ORAL | 3 refills | Status: DC | PRN

## 2017-08-27 NOTE — Progress Notes (Signed)
Subjective:    Patient ID: Robin Ochoa, female    DOB: 26-Oct-1955, 62 y.o.   MRN: [id number]  Robin Ochoa is a 62 y.o. female presenting on 08/27/2017 for Poison Ivy (onset yesterday spreading )  Patient presents for a same [place] appointment.  HPI   POISON IVY DERMATITIS Reports symptoms started yesterday with swelling and redness Right upper eyelid and left side of face with itching. She tried Caladril lotion on it regularly. Tried hot wash cloth. Worsening today - Similar history in past chronically with poison ivy flare up Denies fever chills sweats spreading rash drainage of pus or bleeding open ulceration   Depression screen [facility] 2/9 02/05/2015 10/17/2014  Decreased Interest 0 0  Down, Depressed, Hopeless 0 0  PHQ - 2 Score 0 0    Social History   Tobacco Use  . Smoking status: Never Smoker  . Smokeless tobacco: Never Used  Substance Use Topics  . Alcohol use: Yes    Alcohol/week: 0.6 oz    Types: 1 Cans of beer per week  . Drug use: No    Review of Systems Per HPI unless specifically indicated above     Objective:    BP 130/70   Pulse 76   Temp 98.2 F (36.8 C) (Oral)   Resp 16   Ht 5\' 3"  (1.6 m)   Wt 203 lb 9.6 oz (92.4 kg)   BMI 36.07 kg/m   Wt Readings from Last 3 Encounters:  08/27/17 203 lb 9.6 oz (92.4 kg)  06/22/17 204 lb (92.5 kg)  03/26/16 211 lb 9.6 oz (96 kg)    Physical Exam  Constitutional: She is oriented to person, place, and time. She appears well-developed and well-nourished. No distress.  Well-appearing, comfortable, cooperative  HENT:  Head: Normocephalic and atraumatic.  Mouth/Throat: Oropharynx is clear and moist.  Eyes: Conjunctivae are normal. Right eye exhibits no discharge. Left eye exhibits no discharge.  Cardiovascular: Normal rate.  Pulmonary/Chest: Effort normal.  Musculoskeletal: She exhibits no edema.  Neurological: She is alert and oriented to person, place, and time.  Skin: Skin is warm and dry. No rash noted. She is not  diaphoretic. There is erythema (R facial including eyelid and cheek few other areas of face).  Psychiatric: She has a normal mood and affect. Her behavior is normal.  Well groomed, good eye contact, normal speech and thoughts  Nursing note and vitals reviewed.  Results for orders placed or performed in visit on 03/26/16  Hemoglobin A1c  Result Value Ref Range   Hgb A1c MFr Bld 5.6 <5.7 %   Mean Plasma Glucose 114 mg/dL      Assessment & Plan:   Problem List Items Addressed This Visit    Migraine   Relevant Medications   hydrOXYzine (ATARAX/VISTARIL) 25 MG tablet    Other Visit Diagnoses    Poison ivy dermatitis    -  Primary   Relevant Medications   predniSONE (DELTASONE) 20 MG tablet   hydrOXYzine (ATARAX/VISTARIL) 25 MG tablet      Consistent with poison ivy contact dermatitis, x 1 Predmore duration. Patient with known h/o poison ivy, similar with previous exposures - Afebrile, no evidence of bacterial superinfection from scratching  Plan: 1. Prednisone 20mg  tabs - 7 [place] course - taper - recommend may need extend if not improving - add 1 more week if need, call office 2. Refill existing Hydroxyzine PRN - also uses for migraine 3. Avoid scratching, recommend topicals, prevention 4. Return criteria  given, 1-2 weeks if worsening or not improving   Meds ordered this encounter  Medications  . predniSONE (DELTASONE) 20 MG tablet    Sig: Take daily with food. Start with 60mg  (3 pills) x 2 days, then reduce to 40mg  (2 pills) x 2 days, then 20mg  (1 pill) x 3 days    Dispense:  13 tablet    Refill:  0  . hydrOXYzine (ATARAX/VISTARIL) 25 MG tablet    Sig: Take 1-2 tablets (25-50 mg total) by mouth 2 (two) times daily as needed for anxiety (& headaches).    Dispense:  30 tablet    Refill:  3     Follow up plan: Return if symptoms worsen or fail to improve, for poison ivy.  She should follow-up in future for routine visit for check-up, labs, and screening - recommended send [place]  copy of her last mammogram at [facility], she will resume PCP here after her husband's city insurance runs out and switch to [employer], [facility] 08/28/2017, 12:48 AM

## 2017-08-27 NOTE — Patient Instructions (Addendum)
Thank you for coming to the office today.  It looks like you do have [place] / [place] Recommend to use topical treatments with Calamine, (may try oatmeal bath), and may take Benadryl at night as needed for itching and sleep.  Start Prednisone (antiinflammatory steroid) - for 1 week - call by Monday Tuesday if not improved enough and we can extend it  Refilled Hydroxyzine  If symptoms get worse or do not resolve at all within 2 weeks, please return for re-evaluation. Otherwise, if improved but start to come back, you may call for refill on Prednisone - if develop swelling, redness, fever, and warmth, drainage of pus concern for infection please return sooner. - Try your best to avoid scratching (reduce chances of infection)  In the future, to help prevent poison ivy skin rash it is recommended to thoroughly wash potentially exposed areas with soap or even liquid dish soap (if it is hand safe) within 30 minutes of initial exposure. You can reduce risk of flare up if you wash within 1 to 3 hours, but if you wait more than 3 hours, high likelihood of developing the rash.   Please schedule a Follow-up Appointment to: Return if symptoms worsen or fail to improve, for poison ivy.  If you have any other questions or concerns, please feel free to call the office or send a message through [place]. You may also schedule an earlier appointment if necessary.  Additionally, you may be receiving a survey about your experience at our office within a few days to 1 week by e-mail or mail. We value your feedback.  [name], DO [place]

## 2017-11-09 DIAGNOSIS — Z01419 Encounter for gynecological examination (general) (routine) without abnormal findings: Secondary | ICD-10-CM | POA: Diagnosis not present

## 2017-11-09 DIAGNOSIS — Z6835 Body mass index (BMI) 35.0-35.9, adult: Secondary | ICD-10-CM | POA: Diagnosis not present

## 2017-11-09 DIAGNOSIS — Z780 Asymptomatic menopausal state: Secondary | ICD-10-CM | POA: Diagnosis not present

## 2017-11-11 DIAGNOSIS — Z01419 Encounter for gynecological examination (general) (routine) without abnormal findings: Secondary | ICD-10-CM | POA: Insufficient documentation

## 2017-11-23 DIAGNOSIS — H5203 Hypermetropia, bilateral: Secondary | ICD-10-CM | POA: Diagnosis not present

## 2017-12-07 DIAGNOSIS — M4716 Other spondylosis with myelopathy, lumbar region: Secondary | ICD-10-CM | POA: Diagnosis not present

## 2017-12-07 DIAGNOSIS — M4316 Spondylolisthesis, lumbar region: Secondary | ICD-10-CM | POA: Diagnosis not present

## 2017-12-08 DIAGNOSIS — M4716 Other spondylosis with myelopathy, lumbar region: Secondary | ICD-10-CM | POA: Insufficient documentation

## 2018-02-28 DIAGNOSIS — Z23 Encounter for immunization: Secondary | ICD-10-CM | POA: Diagnosis not present

## 2018-03-01 DIAGNOSIS — L920 Granuloma annulare: Secondary | ICD-10-CM | POA: Diagnosis not present

## 2018-03-02 DIAGNOSIS — Z780 Asymptomatic menopausal state: Secondary | ICD-10-CM | POA: Diagnosis not present

## 2018-03-02 DIAGNOSIS — R922 Inconclusive mammogram: Secondary | ICD-10-CM | POA: Diagnosis not present

## 2018-03-02 DIAGNOSIS — R928 Other abnormal and inconclusive findings on diagnostic imaging of breast: Secondary | ICD-10-CM | POA: Diagnosis not present

## 2018-03-15 DIAGNOSIS — M4716 Other spondylosis with myelopathy, lumbar region: Secondary | ICD-10-CM | POA: Diagnosis not present

## 2018-03-15 DIAGNOSIS — M4316 Spondylolisthesis, lumbar region: Secondary | ICD-10-CM | POA: Diagnosis not present

## 2018-03-28 ENCOUNTER — Encounter: Payer: Self-pay | Admitting: Family Medicine

## 2018-03-28 ENCOUNTER — Ambulatory Visit (INDEPENDENT_AMBULATORY_CARE_PROVIDER_SITE_OTHER): Payer: BLUE CROSS/BLUE SHIELD | Admitting: Family Medicine

## 2018-03-28 VITALS — BP 129/67 | HR 80 | Temp 98.3°F | Resp 16 | Ht 63.0 in | Wt 201.0 lb

## 2018-03-28 DIAGNOSIS — R509 Fever, unspecified: Secondary | ICD-10-CM

## 2018-03-28 DIAGNOSIS — J0100 Acute maxillary sinusitis, unspecified: Secondary | ICD-10-CM

## 2018-03-28 LAB — POCT INFLUENZA A/B
INFLUENZA B, POC: NEGATIVE
Influenza A, POC: NEGATIVE

## 2018-03-28 MED ORDER — BENZONATATE 100 MG PO CAPS: 100.0000 mg | ORAL_CAPSULE | Freq: Three times a day (TID) | ORAL | 0 refills | Status: DC | PRN

## 2018-03-28 MED ORDER — IPRATROPIUM BROMIDE 0.06 % NA SOLN: 2.0000 | Freq: Four times a day (QID) | NASAL | 0 refills | Status: DC

## 2018-03-28 MED ORDER — AZITHROMYCIN 250 MG PO TABS: ORAL_TABLET | ORAL | 0 refills | Status: DC

## 2018-03-28 NOTE — Progress Notes (Signed)
Subjective:    Patient ID: Robin Ochoa, female    DOB: 05-Apr-1956, 62 y.o.   MRN: [id number]  Robin Ochoa is a 62 y.o. female presenting on 03/28/2018 for Chills (rattling cough, bodyache, runny nose onset 4 days)  Patient presents for a same [place] appointment.  HPI   Acute Sinusitis Recent new onset symptoms within past 1 week - Thursday Thanksgiving, felt tired Next Minetti felt "terrible" more sinus symptoms and congestion, thought may have had migraine Over weekend she describes a "rasping and rattle" in throat and has a cough at times Recent contact with granddaughter with croup about 2 weeks ago. Also husband with URI bronchitis similar symptoms for past few weeks. Admits general fatigue and bodyaches, reduced energy but feels she can still do some occasional activity around the house Admits reduced Denies any nausea vomiting, abdominal pain, diarrhea  Health Maintenance: UTD Flu vaccine 02/28/18 [place]  Depression screen [facility] 2/9 02/05/2015 10/17/2014  Decreased Interest 0 0  Down, Depressed, Hopeless 0 0  PHQ - 2 Score 0 0    Social History   Tobacco Use  . Smoking status: Never Smoker  . Smokeless tobacco: Never Used  Substance Use Topics  . Alcohol use: Yes    Alcohol/week: 1.0 standard drinks    Types: 1 Cans of beer per week  . Drug use: No    Review of Systems Per HPI unless specifically indicated above     Objective:    BP 129/67   Pulse 80   Temp 98.3 F (36.8 C) (Oral)   Resp 16   Ht 5\' 3"  (1.6 m)   Wt 201 lb (91.2 kg)   SpO2 96%   BMI 35.61 kg/m   Wt Readings from Last 3 Encounters:  03/28/18 201 lb (91.2 kg)  08/27/17 203 lb 9.6 oz (92.4 kg)  06/22/17 204 lb (92.5 kg)    Physical Exam  Constitutional: She is oriented to person, place, and time. She appears well-developed and well-nourished. No distress.  Well-appearing but slightly tired, comfortable, cooperative, slightly with some residual diaphoresis  HENT:  Head: Normocephalic and  atraumatic.  Mouth/Throat: Oropharynx is clear and moist.  Mild maxillary sinuses tender. Nares patent without purulence or edema. Bilateral TMs clear without erythema, effusion or bulging. Oropharynx clear without erythema, exudates, edema or asymmetry.  Eyes: Conjunctivae are normal. Right eye exhibits no discharge. Left eye exhibits no discharge.  Neck: Normal range of motion. Neck supple. No thyromegaly present.  Cardiovascular: Normal rate, regular rhythm, normal heart sounds and intact distal pulses.  No murmur heard. Pulmonary/Chest: Effort normal and breath sounds normal. No respiratory distress. She has no wheezes. She has no rales.  Musculoskeletal: Normal range of motion. She exhibits no edema.  Lymphadenopathy:    She has no cervical adenopathy.  Neurological: She is alert and oriented to person, place, and time.  Skin: Skin is warm and dry. No rash noted. She is not diaphoretic. No erythema.  Psychiatric: She has a normal mood and affect. Her behavior is normal.  Well groomed, good eye contact, normal speech and thoughts  Nursing note and vitals reviewed.  Results for orders placed or performed in visit on 03/28/18  POCT Influenza A/B  Result Value Ref Range   Influenza A, POC Negative Negative   Influenza B, POC Negative Negative      Assessment & Plan:   Problem List Items Addressed This Visit    None    Visit Diagnoses  Acute non-recurrent maxillary sinusitis    -  Primary   Relevant Medications   azithromycin (ZITHROMAX Z-PAK) 250 MG tablet   ipratropium (ATROVENT) 0.06 % nasal spray   benzonatate (TESSALON) 100 MG capsule   Fever and chills       Relevant Orders   POCT Influenza A/B (Completed)      Consistent with acute maxillary sinusitis, likely initially viral URI vs flu-like syndrome, however seems to have resolved viral symptoms now with more persistent sinus pressure and congestion.  Plan: Rapid Flu negative, already >4 days now, less likely to  benefit from anti flu medication, less clinical suspicion  Will empirically treat for sinusitis given current clinical course - Start Azithromycin Z pak (antibiotic) 2 tabs [place] 1, then 1 tab x 4 days, complete entire course even if improved  Start Tessalon Perls take 1 capsule up to 3 times a [place] as needed for cough Start Atrovent nasal spray decongestant 2 sprays in each nostril up to 4 times daily for 7 days Supportive care OTC meds / mucinex  Return criteria reviewed   Meds ordered this encounter  Medications  . azithromycin (ZITHROMAX Z-PAK) 250 MG tablet    Sig: Take 2 tabs (500mg  total) on [place] 1. Take 1 tab (250mg ) daily for next 4 days.    Dispense:  6 tablet    Refill:  0  . ipratropium (ATROVENT) 0.06 % nasal spray    Sig: Place 2 sprays into both nostrils 4 (four) times daily. For up to 5-7 days then stop.    Dispense:  15 mL    Refill:  0  . benzonatate (TESSALON) 100 MG capsule    Sig: Take 1 capsule (100 mg total) by mouth 3 (three) times daily as needed for cough.    Dispense:  30 capsule    Refill:  0     Follow up plan: Return in about 1 week (around 04/04/2018), or if symptoms worsen or fail to improve, for sinusitis.  [name], [facility] 03/28/2018, 2:37 PM

## 2018-03-28 NOTE — Patient Instructions (Addendum)
Thank you for coming to the office today.  1. It sounds like you have a Sinusitis (Bacterial Infection) - this most likely started as an Upper Respiratory Virus that has settled into an infection.   FLU Test was negative  Start Azithromycin Z pak (antibiotic) 2 tabs [place] 1, then 1 tab x 4 days, complete entire course even if improved Start Tessalon Perls take 1 capsule up to 3 times a [place] as needed for cough Start Atrovent nasal spray decongestant 2 sprays in each nostril up to 4 times daily for 7 days  - Recommend to try using Nasal Saline spray multiple times a [place] to help flush out congestion and clear sinuses - Improve hydration by drinking plenty of clear fluids (water, gatorade) to reduce secretions and thin congestion - Congestion draining down throat can cause irritation. May try warm herbal tea with honey, cough drops - Can take Tylenol or Ibuprofen as needed for fevers - May continue over the counter cold medicine as you are, I would not use any decongestant or mucinex longer than 7 days.  If you develop persistent fever >101F for at least 3 consecutive days, headaches with sinus pain or pressure or persistent earache, please schedule a follow-up evaluation within next few days to week.   Please schedule a Follow-up Appointment to: Return in about 1 week (around 04/04/2018), or if symptoms worsen or fail to improve, for sinusitis.  If you have any other questions or concerns, please feel free to call the office or send a message through [place]. You may also schedule an earlier appointment if necessary.  Additionally, you may be receiving a survey about your experience at our office within a few days to 1 week by e-mail or mail. We value your feedback.  [name], DO [place]

## 2018-04-05 ENCOUNTER — Telehealth: Payer: Self-pay | Admitting: Family Medicine

## 2018-04-05 DIAGNOSIS — R509 Fever, unspecified: Secondary | ICD-10-CM

## 2018-04-05 DIAGNOSIS — J40 Bronchitis, not specified as acute or chronic: Secondary | ICD-10-CM

## 2018-04-05 NOTE — Telephone Encounter (Signed)
Called patient back, spoke with Robin Ochoa, last seen 03/28/18 - see note for details, ultimately treated with Azithromycin Z pak due to PCN allergy, had more sinusitis, likely contributing to cough and symptoms. She also was given Tessalon and Atrovent.  She does feel significantly improved, but still has chest congestion and cough.  I advised her not to repeat Z-pak for now, next step since still running low grade temp would be to come in for Chest X-ray within 24-48 hours, order placed, she will do walk in X-ray and we will follow-up with her based on results. If concerning for pneumonia or if her clinical history is not improved we can send Levaquin antibiotic most likely and possibly cough syrup if needed  Follow-up as planned.  [name], [facility] 04/05/2018, 4:50 PM

## 2018-04-05 NOTE — Telephone Encounter (Signed)
Pt finished z pack but still has cough, congestion and rattling in chest.  Please call [phone]

## 2018-04-05 NOTE — Addendum Note (Signed)
Addended by: Olin Hauser on: 04/05/2018 04:51 PM   Modules accepted: Orders

## 2018-04-06 ENCOUNTER — Ambulatory Visit
Admission: RE | Admit: 2018-04-06 | Discharge: 2018-04-06 | Disposition: A | Discharge status: Home / Self Care | Payer: BLUE CROSS/BLUE SHIELD | Source: Ambulatory Visit | Attending: Family Medicine | Admitting: Family Medicine

## 2018-04-06 DIAGNOSIS — R509 Fever, unspecified: Secondary | ICD-10-CM

## 2018-04-06 DIAGNOSIS — J40 Bronchitis, not specified as acute or chronic: Secondary | ICD-10-CM | POA: Insufficient documentation

## 2018-04-06 DIAGNOSIS — R0989 Other specified symptoms and signs involving the circulatory and respiratory systems: Secondary | ICD-10-CM | POA: Diagnosis not present

## 2018-04-08 ENCOUNTER — Telehealth: Payer: Self-pay | Admitting: Family Medicine

## 2018-04-08 NOTE — Telephone Encounter (Signed)
See prior telephone note.  Called patient back, she returned my call from yesterday.  Chest X-ray results are negative.  She states now feeling better than earlier this week. After Azithromycin 12/2  She has improved energy. Still some cough. But improved congestion.  I advised her no repeat antibiotics at this time. Follow-up or notify us early next week if significant change - may consider levaquin course as add on if needed.  [name], [facility] 04/08/2018, 1:30 PM

## 2018-09-14 DIAGNOSIS — M48062 Spinal stenosis, lumbar region with neurogenic claudication: Secondary | ICD-10-CM | POA: Diagnosis not present

## 2018-09-30 ENCOUNTER — Other Ambulatory Visit: Payer: Self-pay | Admitting: Anesthesiology

## 2018-09-30 DIAGNOSIS — M48062 Spinal stenosis, lumbar region with neurogenic claudication: Secondary | ICD-10-CM

## 2018-10-12 ENCOUNTER — Other Ambulatory Visit: Payer: Self-pay

## 2018-10-12 ENCOUNTER — Ambulatory Visit
Admission: RE | Admit: 2018-10-12 | Discharge: 2018-10-12 | Disposition: A | Discharge status: Home / Self Care | Payer: Medicare HMO | Source: Ambulatory Visit | Attending: Anesthesiology | Admitting: Anesthesiology

## 2018-10-12 DIAGNOSIS — M48062 Spinal stenosis, lumbar region with neurogenic claudication: Secondary | ICD-10-CM | POA: Diagnosis not present

## 2018-10-12 DIAGNOSIS — M545 Low back pain: Secondary | ICD-10-CM | POA: Diagnosis not present

## 2018-10-14 DIAGNOSIS — M48062 Spinal stenosis, lumbar region with neurogenic claudication: Secondary | ICD-10-CM | POA: Diagnosis not present

## 2018-10-14 DIAGNOSIS — I10 Essential (primary) hypertension: Secondary | ICD-10-CM | POA: Diagnosis not present

## 2018-10-14 DIAGNOSIS — Z6836 Body mass index (BMI) 36.0-36.9, adult: Secondary | ICD-10-CM | POA: Diagnosis not present

## 2018-11-14 DIAGNOSIS — M48062 Spinal stenosis, lumbar region with neurogenic claudication: Secondary | ICD-10-CM | POA: Diagnosis not present

## 2018-12-12 DIAGNOSIS — M48061 Spinal stenosis, lumbar region without neurogenic claudication: Secondary | ICD-10-CM | POA: Diagnosis not present

## 2018-12-12 DIAGNOSIS — M48062 Spinal stenosis, lumbar region with neurogenic claudication: Secondary | ICD-10-CM | POA: Diagnosis not present

## 2019-01-17 DIAGNOSIS — M5416 Radiculopathy, lumbar region: Secondary | ICD-10-CM | POA: Diagnosis not present

## 2019-01-17 DIAGNOSIS — M48061 Spinal stenosis, lumbar region without neurogenic claudication: Secondary | ICD-10-CM | POA: Diagnosis not present

## 2019-01-25 DIAGNOSIS — I10 Essential (primary) hypertension: Secondary | ICD-10-CM | POA: Diagnosis not present

## 2019-01-25 DIAGNOSIS — Z6836 Body mass index (BMI) 36.0-36.9, adult: Secondary | ICD-10-CM | POA: Diagnosis not present

## 2019-01-25 DIAGNOSIS — M5416 Radiculopathy, lumbar region: Secondary | ICD-10-CM | POA: Diagnosis not present

## 2019-02-03 DIAGNOSIS — R69 Illness, unspecified: Secondary | ICD-10-CM | POA: Diagnosis not present

## 2019-02-05 DIAGNOSIS — K760 Fatty (change of) liver, not elsewhere classified: Secondary | ICD-10-CM | POA: Diagnosis not present

## 2019-02-05 DIAGNOSIS — M48062 Spinal stenosis, lumbar region with neurogenic claudication: Secondary | ICD-10-CM | POA: Diagnosis not present

## 2019-02-05 DIAGNOSIS — N200 Calculus of kidney: Secondary | ICD-10-CM | POA: Diagnosis not present

## 2019-02-05 DIAGNOSIS — R1031 Right lower quadrant pain: Secondary | ICD-10-CM | POA: Diagnosis not present

## 2019-02-05 DIAGNOSIS — R112 Nausea with vomiting, unspecified: Secondary | ICD-10-CM | POA: Diagnosis not present

## 2019-02-05 DIAGNOSIS — M5410 Radiculopathy, site unspecified: Secondary | ICD-10-CM | POA: Diagnosis not present

## 2019-02-05 DIAGNOSIS — I739 Peripheral vascular disease, unspecified: Secondary | ICD-10-CM | POA: Diagnosis not present

## 2019-02-05 DIAGNOSIS — Z7982 Long term (current) use of aspirin: Secondary | ICD-10-CM | POA: Diagnosis not present

## 2019-02-05 DIAGNOSIS — Z20828 Contact with and (suspected) exposure to other viral communicable diseases: Secondary | ICD-10-CM | POA: Diagnosis not present

## 2019-02-05 DIAGNOSIS — R932 Abnormal findings on diagnostic imaging of liver and biliary tract: Secondary | ICD-10-CM | POA: Diagnosis not present

## 2019-02-05 DIAGNOSIS — Z880 Allergy status to penicillin: Secondary | ICD-10-CM | POA: Diagnosis not present

## 2019-02-05 DIAGNOSIS — R197 Diarrhea, unspecified: Secondary | ICD-10-CM | POA: Diagnosis not present

## 2019-02-05 DIAGNOSIS — R1032 Left lower quadrant pain: Secondary | ICD-10-CM | POA: Diagnosis not present

## 2019-02-06 DIAGNOSIS — M5416 Radiculopathy, lumbar region: Secondary | ICD-10-CM | POA: Diagnosis not present

## 2019-02-06 DIAGNOSIS — Z6835 Body mass index (BMI) 35.0-35.9, adult: Secondary | ICD-10-CM | POA: Diagnosis not present

## 2019-02-06 DIAGNOSIS — M48062 Spinal stenosis, lumbar region with neurogenic claudication: Secondary | ICD-10-CM | POA: Diagnosis not present

## 2019-02-06 DIAGNOSIS — M48061 Spinal stenosis, lumbar region without neurogenic claudication: Secondary | ICD-10-CM | POA: Diagnosis not present

## 2019-02-08 DIAGNOSIS — Z1159 Encounter for screening for other viral diseases: Secondary | ICD-10-CM | POA: Diagnosis not present

## 2019-02-14 DIAGNOSIS — M5117 Intervertebral disc disorders with radiculopathy, lumbosacral region: Secondary | ICD-10-CM | POA: Diagnosis not present

## 2019-02-14 DIAGNOSIS — M5416 Radiculopathy, lumbar region: Secondary | ICD-10-CM | POA: Diagnosis not present

## 2019-03-05 ENCOUNTER — Encounter: Payer: Self-pay | Admitting: Emergency Medicine

## 2019-03-05 ENCOUNTER — Ambulatory Visit
Admission: EM | Admit: 2019-03-05 | Discharge: 2019-03-05 | Disposition: A | Discharge status: Home / Self Care | Payer: Medicare HMO | Attending: Family Medicine | Admitting: Family Medicine

## 2019-03-05 ENCOUNTER — Other Ambulatory Visit: Payer: Self-pay

## 2019-03-05 DIAGNOSIS — R69 Illness, unspecified: Secondary | ICD-10-CM | POA: Diagnosis not present

## 2019-03-05 DIAGNOSIS — F3289 Other specified depressive episodes: Secondary | ICD-10-CM

## 2019-03-05 LAB — COMPREHENSIVE METABOLIC PANEL
ALT: 19 U/L (ref 0–44)
AST: 18 U/L (ref 15–41)
Albumin: 4.5 g/dL (ref 3.5–5.0)
Alkaline Phosphatase: 88 U/L (ref 38–126)
Anion gap: 9 (ref 5–15)
BUN: 14 mg/dL (ref 8–23)
CO2: 22 mmol/L (ref 22–32)
Calcium: 9.9 mg/dL (ref 8.9–10.3)
Chloride: 108 mmol/L (ref 98–111)
Creatinine, Ser: 0.6 mg/dL (ref 0.44–1.00)
GFR calc Af Amer: >60 mL/min (ref >60)
GFR calc non Af Amer: >60 mL/min (ref >60)
Glucose, Bld: 125 mg/dL — ABNORMAL HIGH (ref 70–99)
Potassium: 3.2 mmol/L — ABNORMAL LOW (ref 3.5–5.1)
Sodium: 139 mmol/L (ref 135–145)
Total Bilirubin: 0.7 mg/dL (ref 0.3–1.2)
Total Protein: 7.6 g/dL (ref 6.5–8.1)

## 2019-03-05 MED ORDER — SERTRALINE HCL 25 MG PO TABS: 25.0000 mg | ORAL_TABLET | Freq: Every day | ORAL | 0 refills | Status: DC

## 2019-03-05 NOTE — Discharge Instructions (Signed)
Trial of over the counter melatonin at night for sleep Follow up with Primary Care Provider this week

## 2019-03-05 NOTE — ED Provider Notes (Signed)
[facility]    CSN: [id number] Arrival date & time: 03/05/19  1438      History   Chief Complaint Chief Complaint  Patient presents with  . Depression    HPI Robin Ochoa is a 63 y.o. female.   63 yo female with a c/o depression. States she's been through a lot of stress for the past several months with chronic pain and back surgery last month. Has been very tearful, sad, feeling hopelessness. However patient denies suicidal ideation or homicidal ideation.  Has an appointment with her PCP this week.    Depression    Past Medical History:  Diagnosis Date  . Migraine   . Neck pain, chronic   . Spinal stenosis     Patient Active Problem List   Diagnosis Date Noted  . Migraine 08/27/2017  . Mixed hyperlipidemia 03/26/2016  . 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 Abnormal glucose 03/26/2016  . Mitral valve disease 07/04/2015  . Morbid obesity ([place]) 07/04/2015  . Knee pain, acute 02/05/2015  . Leg swelling 10/17/2014  . Tinea corporis 10/17/2014  . Menopausal hot flushes 10/17/2014  . Cervical pain 09/06/2014  . Leg paresthesia 09/06/2014    Past Surgical History:  Procedure Laterality Date  . BACK SURGERY    . VAGINAL HYSTERECTOMY  63 years old   Tumors-     OB History   No obstetric history on file.      Home Medications    Prior to Admission medications   Medication Sig Start Date End Date Taking? Authorizing Provider  aspirin EC 81 MG tablet Take 81 mg by mouth.   Yes [provider]  Multiple Vitamins-Minerals (CENTRUM ADULTS PO) Take by mouth.   Yes [provider]  venlafaxine (EFFEXOR) 75 MG tablet Take 1 capsule daily 11/20/15  Yes Krebs, Amy Lauren, NP  azithromycin (ZITHROMAX Z-PAK) 250 MG tablet Take 2 tabs (500mg  total) on [place] 1. Take 1 tab (250mg ) daily for next 4 days. 03/28/18   Karamalegos, Devonne Doughty, DO  benzonatate (TESSALON) 100 MG capsule Take 1 capsule (100 mg total) by  mouth 3 (three) times daily as needed for cough. 03/28/18   Karamalegos, Devonne Doughty, DO  cyclobenzaprine (FLEXERIL) 10 MG tablet Take 1 by mouth every 8 hours as needed for muscle spasm 12/07/17   [provider]  gabapentin (NEURONTIN) 300 MG capsule One cap in AM, one cap at 3pm, 2 caps at bedtime 01/27/18   [provider]  hydrochlorothiazide (HYDRODIURIL) 25 MG tablet Take 1 tablet (25 mg total) by mouth daily as needed. 06/22/17   Minna Merritts, MD  HYDROcodone-acetaminophen (NORCO/VICODIN) 5-325 MG tablet Take by mouth. 03/15/18   [provider]  hydrOXYzine (ATARAX/VISTARIL) 25 MG tablet Take 1-2 tablets (25-50 mg total) by mouth 2 (two) times daily as needed for anxiety (& headaches). 08/27/17   Karamalegos, Devonne Doughty, DO  ipratropium (ATROVENT) 0.06 % nasal spray Place 2 sprays into both nostrils 4 (four) times daily. For up to 5-7 days then stop. 03/28/18   Karamalegos, Devonne Doughty, DO  sertraline (ZOLOFT) 25 MG tablet Take 1 tablet (25 mg total) by mouth daily. 03/05/19   Norval Gable, MD  tiZANidine (ZANAFLEX) 4 MG tablet Take 4 mg by mouth every 6 (six) hours as needed for muscle spasms.    [provider]    Family History Family History  Problem Relation Age of Onset  . Heart failure Mother   .  Cancer Father     Social History Social History   Tobacco Use  . Smoking status: Never Smoker  . Smokeless tobacco: Never Used  Substance Use Topics  . Alcohol use: Yes    Alcohol/week: 1.0 standard drinks    Types: 1 Cans of beer per week  . Drug use: No     Allergies   Ibuprofen and Penicillins   Review of Systems Review of Systems  Psychiatric/Behavioral: Positive for depression.     Physical Exam Triage Vital Signs ED Triage Vitals  Enc Vitals Group     BP 03/05/19 1512 (!) 157/89     Pulse Rate 03/05/19 1512 96     Resp 03/05/19 1512 16     Temp 03/05/19 1512 98.3 F (36.8 C)     Temp Source 03/05/19 1512 Oral      SpO2 03/05/19 1512 97 %     Weight 03/05/19 1508 195 lb (88.5 kg)     Height 03/05/19 1508 5\' 3"  (1.6 m)     Head Circumference --      Peak Flow --      Pain Score 03/05/19 1507 5     Pain Loc --      Pain Edu? --      Excl. in [place]? --    No data found.  Updated Vital Signs BP (!) 157/89 (BP Location: Right Arm)   Pulse 96   Temp 98.3 F (36.8 C) (Oral)   Resp 16   Ht 5\' 3"  (1.6 m)   Wt 88.5 kg   SpO2 97%   BMI 34.54 kg/m   Visual Acuity Right Eye Distance:   Left Eye Distance:   Bilateral Distance:    Right Eye Near:   Left Eye Near:    Bilateral Near:     Physical Exam Vitals signs and nursing note reviewed.  Constitutional:      General: She is not in acute distress.    Appearance: She is not toxic-appearing or diaphoretic.  Neurological:     General: No focal deficit present.     Mental Status: She is alert.  Psychiatric:        Attention and Perception: Attention normal.        Mood and Affect: Mood is depressed.        Speech: Speech normal.        Behavior: Behavior normal. Behavior is cooperative.        Thought Content: Thought content normal.        Judgment: Judgment normal.      UC Treatments / Results  Labs (all labs ordered are listed, but only abnormal results are displayed) Labs Reviewed  COMPREHENSIVE METABOLIC PANEL - Abnormal; Notable for the following components:      Result Value   Potassium 3.2 (*)    Glucose, Bld 125 (*)    All other components within normal limits    EKG   Radiology No results found.  Procedures Procedures (including critical care time)  Medications Ordered in UC Medications - No data to display  Initial Impression / Assessment and Plan / UC Course  I have reviewed the triage vital signs and the nursing notes.  Pertinent labs & imaging results that were available during my care of the patient were reviewed by me and considered in my medical decision making (see chart for details).      Final  Clinical Impressions(s) / UC Diagnoses   Final diagnoses:  Other depression  Discharge Instructions     Trial of over the counter melatonin at night for sleep Follow up with Primary Care Provider this week    ED Prescriptions    Medication Sig Dispense Auth. Provider   sertraline (ZOLOFT) 25 MG tablet Take 1 tablet (25 mg total) by mouth daily. 7 tablet Norval Gable, MD     1. Lab results and diagnosis reviewed with patient 2. rx as per orders above; reviewed possible side effects, interactions, risks and benefits  3. Follow up with PCP as scheduled this week  PDMP not reviewed this encounter.   Norval Gable, MD 03/05/19 [phone]

## 2019-03-05 NOTE — ED Triage Notes (Signed)
Patient states that she had back surgery on 02/15/2019.   Patient states that she saw her surgeon on Thursday and told him that she felt depressed.  Patient has an appointment with her PCP on Tuesday but felt she could not wait till then.  Patient states that she feels worthless and has been crying all [place].

## 2019-03-07 DIAGNOSIS — Z1211 Encounter for screening for malignant neoplasm of colon: Secondary | ICD-10-CM | POA: Diagnosis not present

## 2019-03-07 DIAGNOSIS — M5441 Lumbago with sciatica, right side: Secondary | ICD-10-CM | POA: Diagnosis not present

## 2019-03-07 DIAGNOSIS — R69 Illness, unspecified: Secondary | ICD-10-CM | POA: Diagnosis not present

## 2019-03-07 DIAGNOSIS — Z6834 Body mass index (BMI) 34.0-34.9, adult: Secondary | ICD-10-CM | POA: Diagnosis not present

## 2019-03-07 DIAGNOSIS — G8929 Other chronic pain: Secondary | ICD-10-CM | POA: Diagnosis not present

## 2019-03-07 DIAGNOSIS — M5442 Lumbago with sciatica, left side: Secondary | ICD-10-CM | POA: Diagnosis not present

## 2019-04-04 DIAGNOSIS — M5416 Radiculopathy, lumbar region: Secondary | ICD-10-CM | POA: Diagnosis not present

## 2019-04-04 DIAGNOSIS — R030 Elevated blood-pressure reading, without diagnosis of hypertension: Secondary | ICD-10-CM | POA: Diagnosis not present

## 2019-04-04 DIAGNOSIS — M48062 Spinal stenosis, lumbar region with neurogenic claudication: Secondary | ICD-10-CM | POA: Diagnosis not present

## 2019-04-04 DIAGNOSIS — Z6834 Body mass index (BMI) 34.0-34.9, adult: Secondary | ICD-10-CM | POA: Diagnosis not present

## 2019-04-19 DIAGNOSIS — R69 Illness, unspecified: Secondary | ICD-10-CM | POA: Diagnosis not present

## 2019-04-19 DIAGNOSIS — F411 Generalized anxiety disorder: Secondary | ICD-10-CM | POA: Diagnosis not present

## 2019-04-19 DIAGNOSIS — Z79891 Long term (current) use of opiate analgesic: Secondary | ICD-10-CM | POA: Diagnosis not present

## 2019-04-23 IMAGING — DX DG CHEST 2V
2 series · 2 of 2 positions shown · non-contrast
Comparison: None.

CLINICAL DATA: Chest congestion for several days

EXAM:
CHEST - 2 VIEW

[chest pa]
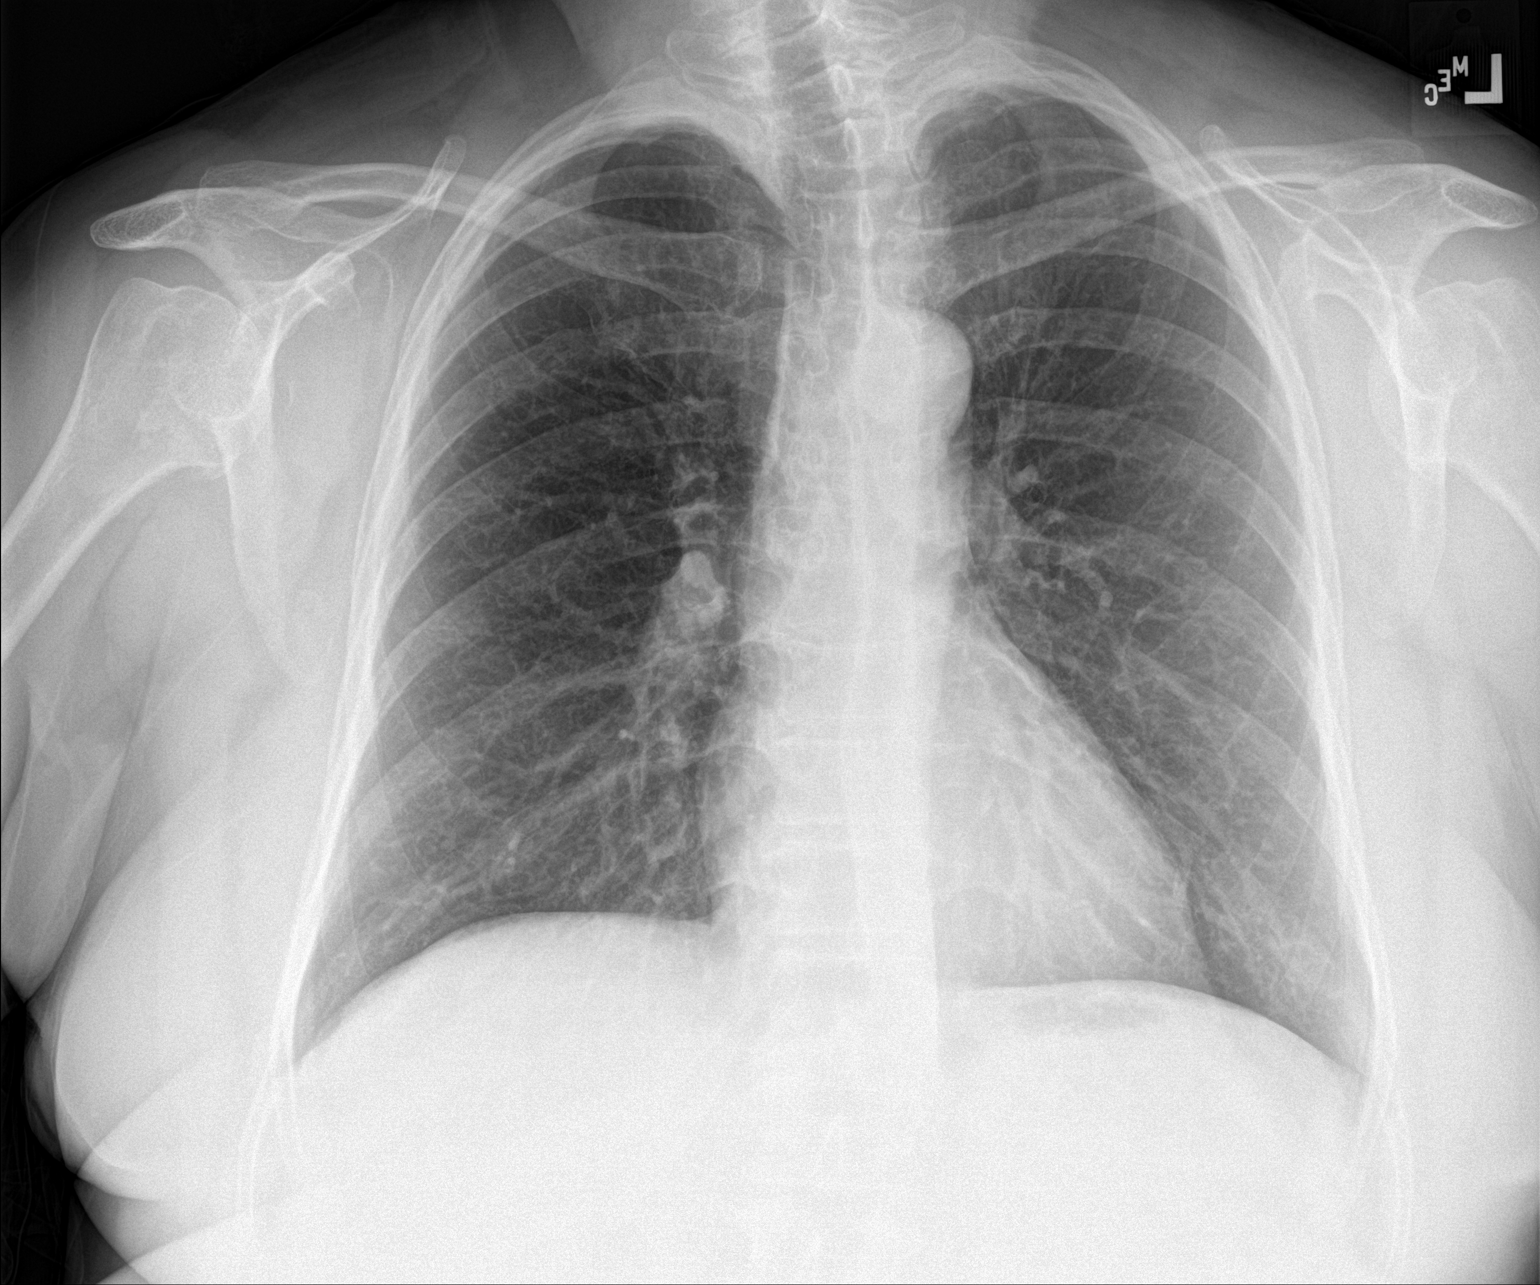

[chest lat]
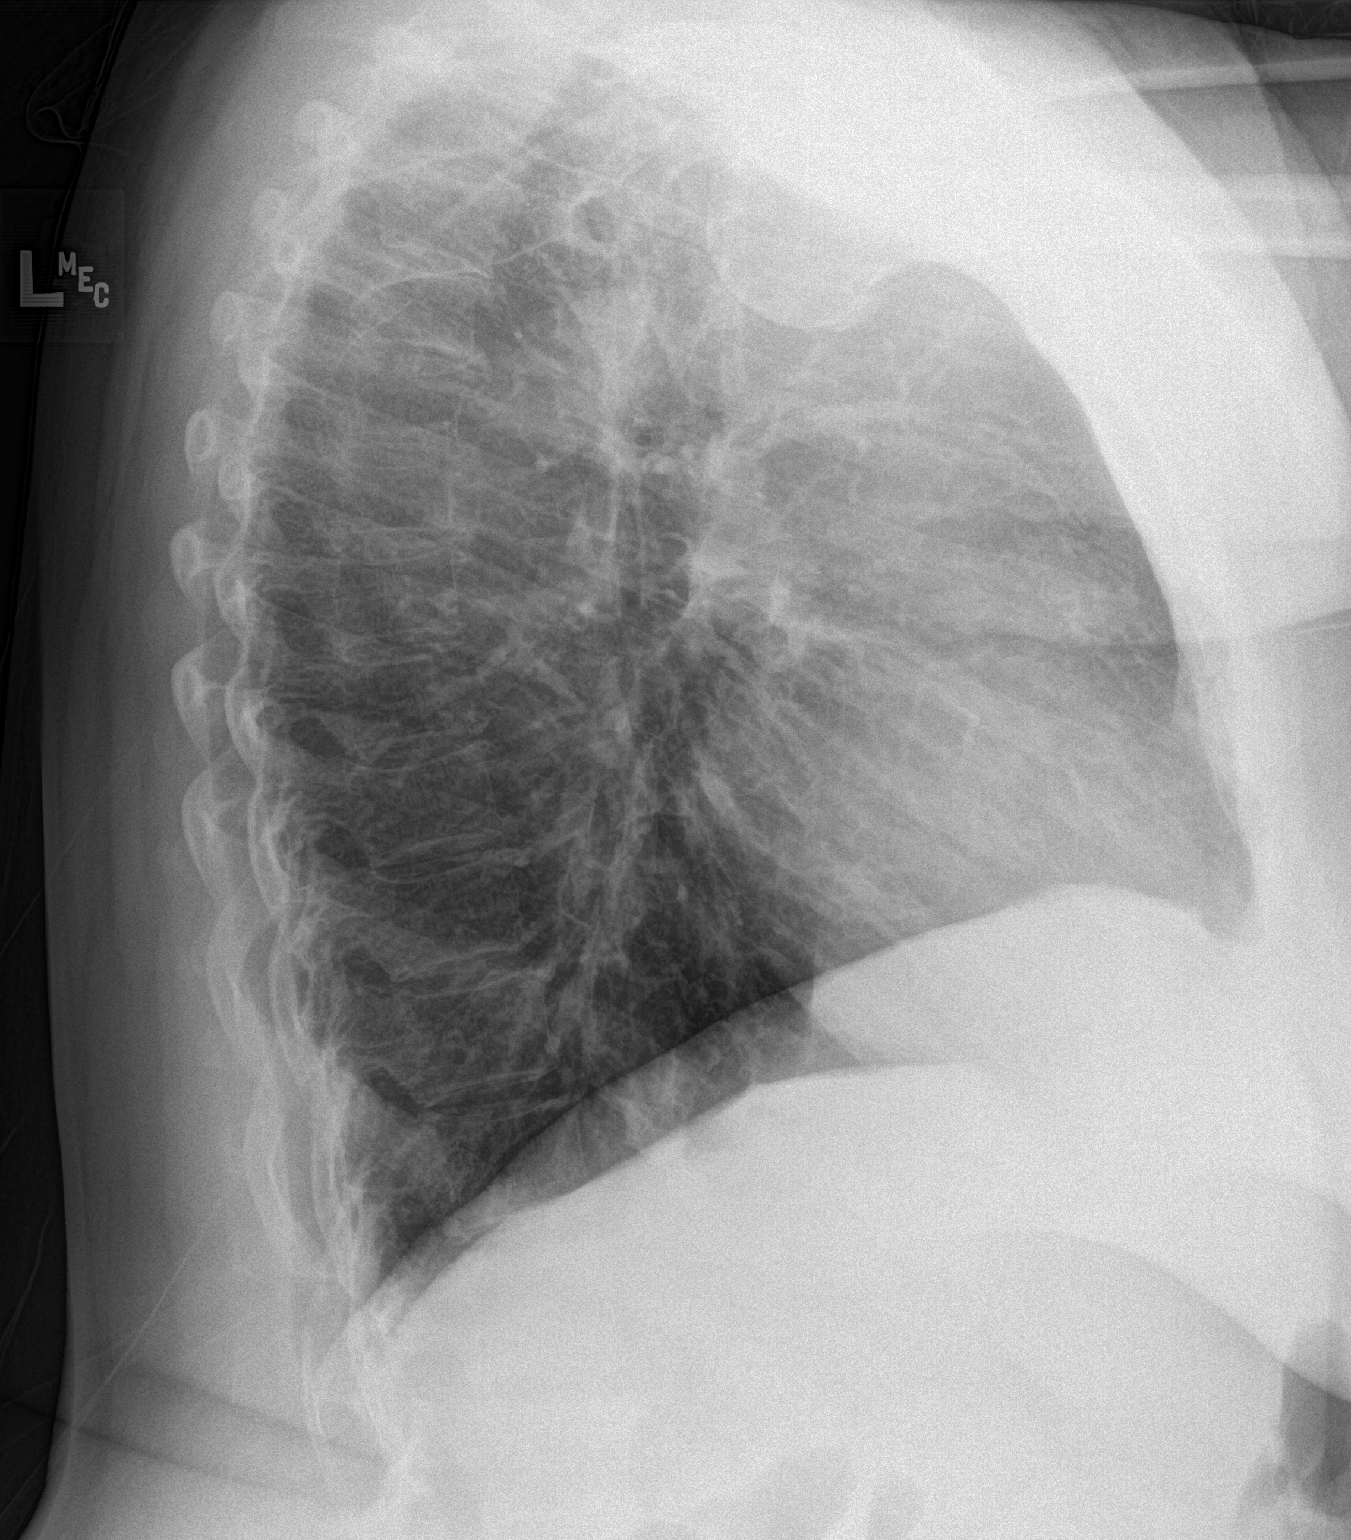

[2 of 2 positions shown; findings below may reference images not displayed]

FINDINGS: The heart size and mediastinal contours are within normal limits.
Both lungs are clear. The visualized skeletal structures are
unremarkable.
IMPRESSION: No acute abnormality noted.

## 2019-05-02 DIAGNOSIS — Z5181 Encounter for therapeutic drug level monitoring: Secondary | ICD-10-CM | POA: Diagnosis not present

## 2019-05-02 DIAGNOSIS — I10 Essential (primary) hypertension: Secondary | ICD-10-CM | POA: Diagnosis not present

## 2019-05-02 DIAGNOSIS — Z79899 Other long term (current) drug therapy: Secondary | ICD-10-CM | POA: Diagnosis not present

## 2019-05-02 DIAGNOSIS — Z79891 Long term (current) use of opiate analgesic: Secondary | ICD-10-CM | POA: Diagnosis not present

## 2019-05-02 DIAGNOSIS — M5416 Radiculopathy, lumbar region: Secondary | ICD-10-CM | POA: Diagnosis not present

## 2019-05-02 DIAGNOSIS — Z6834 Body mass index (BMI) 34.0-34.9, adult: Secondary | ICD-10-CM | POA: Diagnosis not present

## 2019-05-02 DIAGNOSIS — M7070 Other bursitis of hip, unspecified hip: Secondary | ICD-10-CM | POA: Diagnosis not present

## 2019-05-03 DIAGNOSIS — R5383 Other fatigue: Secondary | ICD-10-CM | POA: Diagnosis not present

## 2019-05-03 DIAGNOSIS — N958 Other specified menopausal and perimenopausal disorders: Secondary | ICD-10-CM | POA: Diagnosis not present

## 2019-05-03 DIAGNOSIS — Z0000 Encounter for general adult medical examination without abnormal findings: Secondary | ICD-10-CM | POA: Diagnosis not present

## 2019-05-03 DIAGNOSIS — Z1211 Encounter for screening for malignant neoplasm of colon: Secondary | ICD-10-CM | POA: Diagnosis not present

## 2019-05-03 DIAGNOSIS — I059 Rheumatic mitral valve disease, unspecified: Secondary | ICD-10-CM | POA: Diagnosis not present

## 2019-05-03 DIAGNOSIS — Z6834 Body mass index (BMI) 34.0-34.9, adult: Secondary | ICD-10-CM | POA: Diagnosis not present

## 2019-05-03 DIAGNOSIS — Z1159 Encounter for screening for other viral diseases: Secondary | ICD-10-CM | POA: Diagnosis not present

## 2019-05-03 DIAGNOSIS — E782 Mixed hyperlipidemia: Secondary | ICD-10-CM | POA: Diagnosis not present

## 2019-05-16 DIAGNOSIS — Z6834 Body mass index (BMI) 34.0-34.9, adult: Secondary | ICD-10-CM | POA: Diagnosis not present

## 2019-05-16 DIAGNOSIS — Z23 Encounter for immunization: Secondary | ICD-10-CM | POA: Diagnosis not present

## 2019-05-16 DIAGNOSIS — M79605 Pain in left leg: Secondary | ICD-10-CM | POA: Diagnosis not present

## 2019-05-16 DIAGNOSIS — R69 Illness, unspecified: Secondary | ICD-10-CM | POA: Diagnosis not present

## 2019-05-16 DIAGNOSIS — M4716 Other spondylosis with myelopathy, lumbar region: Secondary | ICD-10-CM | POA: Diagnosis not present

## 2019-05-16 DIAGNOSIS — R5383 Other fatigue: Secondary | ICD-10-CM | POA: Diagnosis not present

## 2019-05-23 DIAGNOSIS — M79605 Pain in left leg: Secondary | ICD-10-CM | POA: Diagnosis not present

## 2019-05-23 DIAGNOSIS — M47816 Spondylosis without myelopathy or radiculopathy, lumbar region: Secondary | ICD-10-CM | POA: Diagnosis not present

## 2019-05-30 DIAGNOSIS — M47816 Spondylosis without myelopathy or radiculopathy, lumbar region: Secondary | ICD-10-CM | POA: Diagnosis not present

## 2019-05-30 DIAGNOSIS — M79605 Pain in left leg: Secondary | ICD-10-CM | POA: Diagnosis not present

## 2019-06-01 DIAGNOSIS — M47816 Spondylosis without myelopathy or radiculopathy, lumbar region: Secondary | ICD-10-CM | POA: Diagnosis not present

## 2019-06-01 DIAGNOSIS — M79605 Pain in left leg: Secondary | ICD-10-CM | POA: Diagnosis not present

## 2019-06-01 DIAGNOSIS — M4716 Other spondylosis with myelopathy, lumbar region: Secondary | ICD-10-CM | POA: Diagnosis not present

## 2019-06-06 DIAGNOSIS — M47816 Spondylosis without myelopathy or radiculopathy, lumbar region: Secondary | ICD-10-CM | POA: Diagnosis not present

## 2019-06-06 DIAGNOSIS — M79605 Pain in left leg: Secondary | ICD-10-CM | POA: Diagnosis not present

## 2019-06-08 DIAGNOSIS — M47816 Spondylosis without myelopathy or radiculopathy, lumbar region: Secondary | ICD-10-CM | POA: Diagnosis not present

## 2019-06-08 DIAGNOSIS — M79605 Pain in left leg: Secondary | ICD-10-CM | POA: Diagnosis not present

## 2019-06-08 DIAGNOSIS — R69 Illness, unspecified: Secondary | ICD-10-CM | POA: Diagnosis not present

## 2019-06-15 DIAGNOSIS — M47816 Spondylosis without myelopathy or radiculopathy, lumbar region: Secondary | ICD-10-CM | POA: Diagnosis not present

## 2019-06-15 DIAGNOSIS — M79605 Pain in left leg: Secondary | ICD-10-CM | POA: Diagnosis not present

## 2019-06-20 DIAGNOSIS — M47816 Spondylosis without myelopathy or radiculopathy, lumbar region: Secondary | ICD-10-CM | POA: Diagnosis not present

## 2019-06-20 DIAGNOSIS — M79605 Pain in left leg: Secondary | ICD-10-CM | POA: Diagnosis not present

## 2019-06-22 DIAGNOSIS — M79605 Pain in left leg: Secondary | ICD-10-CM | POA: Diagnosis not present

## 2019-06-22 DIAGNOSIS — M47816 Spondylosis without myelopathy or radiculopathy, lumbar region: Secondary | ICD-10-CM | POA: Diagnosis not present

## 2019-06-27 DIAGNOSIS — M47816 Spondylosis without myelopathy or radiculopathy, lumbar region: Secondary | ICD-10-CM | POA: Diagnosis not present

## 2019-06-27 DIAGNOSIS — M79605 Pain in left leg: Secondary | ICD-10-CM | POA: Diagnosis not present

## 2019-07-05 DIAGNOSIS — M4716 Other spondylosis with myelopathy, lumbar region: Secondary | ICD-10-CM | POA: Diagnosis not present

## 2019-07-05 DIAGNOSIS — Z6836 Body mass index (BMI) 36.0-36.9, adult: Secondary | ICD-10-CM | POA: Diagnosis not present

## 2019-07-05 DIAGNOSIS — R202 Paresthesia of skin: Secondary | ICD-10-CM | POA: Diagnosis not present

## 2019-07-06 DIAGNOSIS — M79605 Pain in left leg: Secondary | ICD-10-CM | POA: Diagnosis not present

## 2019-07-06 DIAGNOSIS — M47816 Spondylosis without myelopathy or radiculopathy, lumbar region: Secondary | ICD-10-CM | POA: Diagnosis not present

## 2019-07-19 DIAGNOSIS — M47816 Spondylosis without myelopathy or radiculopathy, lumbar region: Secondary | ICD-10-CM | POA: Diagnosis not present

## 2019-07-19 DIAGNOSIS — M79605 Pain in left leg: Secondary | ICD-10-CM | POA: Diagnosis not present

## 2019-07-26 DIAGNOSIS — M79605 Pain in left leg: Secondary | ICD-10-CM | POA: Diagnosis not present

## 2019-07-26 DIAGNOSIS — M47816 Spondylosis without myelopathy or radiculopathy, lumbar region: Secondary | ICD-10-CM | POA: Diagnosis not present

## 2019-08-02 DIAGNOSIS — M47816 Spondylosis without myelopathy or radiculopathy, lumbar region: Secondary | ICD-10-CM | POA: Diagnosis not present

## 2019-08-02 DIAGNOSIS — M79605 Pain in left leg: Secondary | ICD-10-CM | POA: Diagnosis not present

## 2019-08-09 DIAGNOSIS — M79605 Pain in left leg: Secondary | ICD-10-CM | POA: Diagnosis not present

## 2019-08-09 DIAGNOSIS — M47816 Spondylosis without myelopathy or radiculopathy, lumbar region: Secondary | ICD-10-CM | POA: Diagnosis not present

## 2019-08-23 DIAGNOSIS — M79605 Pain in left leg: Secondary | ICD-10-CM | POA: Diagnosis not present

## 2019-08-23 DIAGNOSIS — M47816 Spondylosis without myelopathy or radiculopathy, lumbar region: Secondary | ICD-10-CM | POA: Diagnosis not present

## 2019-08-30 DIAGNOSIS — Z6836 Body mass index (BMI) 36.0-36.9, adult: Secondary | ICD-10-CM | POA: Diagnosis not present

## 2019-08-30 DIAGNOSIS — R69 Illness, unspecified: Secondary | ICD-10-CM | POA: Diagnosis not present

## 2019-08-30 DIAGNOSIS — R5383 Other fatigue: Secondary | ICD-10-CM | POA: Diagnosis not present

## 2019-08-30 DIAGNOSIS — M4716 Other spondylosis with myelopathy, lumbar region: Secondary | ICD-10-CM | POA: Diagnosis not present

## 2019-08-30 DIAGNOSIS — M79605 Pain in left leg: Secondary | ICD-10-CM | POA: Diagnosis not present

## 2019-10-06 DIAGNOSIS — G8929 Other chronic pain: Secondary | ICD-10-CM | POA: Diagnosis not present

## 2019-10-06 DIAGNOSIS — R69 Illness, unspecified: Secondary | ICD-10-CM | POA: Diagnosis not present

## 2019-10-06 DIAGNOSIS — F411 Generalized anxiety disorder: Secondary | ICD-10-CM | POA: Diagnosis not present

## 2019-10-11 DIAGNOSIS — G894 Chronic pain syndrome: Secondary | ICD-10-CM | POA: Diagnosis not present

## 2019-10-11 DIAGNOSIS — M4727 Other spondylosis with radiculopathy, lumbosacral region: Secondary | ICD-10-CM | POA: Diagnosis not present

## 2019-10-11 DIAGNOSIS — M47816 Spondylosis without myelopathy or radiculopathy, lumbar region: Secondary | ICD-10-CM | POA: Diagnosis not present

## 2019-10-18 DIAGNOSIS — N649 Disorder of breast, unspecified: Secondary | ICD-10-CM | POA: Diagnosis not present

## 2019-10-18 DIAGNOSIS — N951 Menopausal and female climacteric states: Secondary | ICD-10-CM | POA: Diagnosis not present

## 2019-10-18 DIAGNOSIS — R922 Inconclusive mammogram: Secondary | ICD-10-CM | POA: Diagnosis not present

## 2019-11-08 DIAGNOSIS — M47816 Spondylosis without myelopathy or radiculopathy, lumbar region: Secondary | ICD-10-CM | POA: Diagnosis not present

## 2019-11-08 DIAGNOSIS — G8929 Other chronic pain: Secondary | ICD-10-CM | POA: Diagnosis not present

## 2019-11-28 DIAGNOSIS — Z6836 Body mass index (BMI) 36.0-36.9, adult: Secondary | ICD-10-CM | POA: Diagnosis not present

## 2019-11-28 DIAGNOSIS — K635 Polyp of colon: Secondary | ICD-10-CM | POA: Diagnosis not present

## 2019-11-28 DIAGNOSIS — M4716 Other spondylosis with myelopathy, lumbar region: Secondary | ICD-10-CM | POA: Diagnosis not present

## 2019-12-05 DIAGNOSIS — M47816 Spondylosis without myelopathy or radiculopathy, lumbar region: Secondary | ICD-10-CM | POA: Diagnosis not present

## 2019-12-08 DIAGNOSIS — H5203 Hypermetropia, bilateral: Secondary | ICD-10-CM | POA: Diagnosis not present

## 2020-01-11 DIAGNOSIS — G8929 Other chronic pain: Secondary | ICD-10-CM | POA: Diagnosis not present

## 2020-01-11 DIAGNOSIS — R69 Illness, unspecified: Secondary | ICD-10-CM | POA: Diagnosis not present

## 2020-01-11 DIAGNOSIS — M47816 Spondylosis without myelopathy or radiculopathy, lumbar region: Secondary | ICD-10-CM | POA: Diagnosis not present

## 2020-01-11 DIAGNOSIS — Z6836 Body mass index (BMI) 36.0-36.9, adult: Secondary | ICD-10-CM | POA: Diagnosis not present

## 2020-01-11 DIAGNOSIS — F3341 Major depressive disorder, recurrent, in partial remission: Secondary | ICD-10-CM | POA: Diagnosis not present

## 2020-01-11 DIAGNOSIS — Z0000 Encounter for general adult medical examination without abnormal findings: Secondary | ICD-10-CM | POA: Diagnosis not present

## 2020-01-31 DIAGNOSIS — M48061 Spinal stenosis, lumbar region without neurogenic claudication: Secondary | ICD-10-CM | POA: Diagnosis not present

## 2020-01-31 DIAGNOSIS — M5136 Other intervertebral disc degeneration, lumbar region: Secondary | ICD-10-CM | POA: Diagnosis not present

## 2020-01-31 DIAGNOSIS — M5416 Radiculopathy, lumbar region: Secondary | ICD-10-CM | POA: Diagnosis not present

## 2020-02-09 DIAGNOSIS — R69 Illness, unspecified: Secondary | ICD-10-CM | POA: Diagnosis not present

## 2020-02-09 DIAGNOSIS — G8928 Other chronic postprocedural pain: Secondary | ICD-10-CM | POA: Diagnosis not present

## 2020-02-09 DIAGNOSIS — F411 Generalized anxiety disorder: Secondary | ICD-10-CM | POA: Diagnosis not present

## 2020-02-20 DIAGNOSIS — G894 Chronic pain syndrome: Secondary | ICD-10-CM | POA: Diagnosis not present

## 2020-02-20 DIAGNOSIS — M5416 Radiculopathy, lumbar region: Secondary | ICD-10-CM | POA: Diagnosis not present

## 2020-02-28 DIAGNOSIS — Z6836 Body mass index (BMI) 36.0-36.9, adult: Secondary | ICD-10-CM | POA: Diagnosis not present

## 2020-02-28 DIAGNOSIS — R69 Illness, unspecified: Secondary | ICD-10-CM | POA: Diagnosis not present

## 2020-02-28 DIAGNOSIS — M8468XA Pathological fracture in other disease, other site, initial encounter for fracture: Secondary | ICD-10-CM | POA: Diagnosis not present

## 2020-02-28 DIAGNOSIS — M4716 Other spondylosis with myelopathy, lumbar region: Secondary | ICD-10-CM | POA: Diagnosis not present

## 2020-02-28 DIAGNOSIS — Z13820 Encounter for screening for osteoporosis: Secondary | ICD-10-CM | POA: Diagnosis not present

## 2020-02-28 DIAGNOSIS — R5383 Other fatigue: Secondary | ICD-10-CM | POA: Diagnosis not present

## 2020-04-16 DIAGNOSIS — M5416 Radiculopathy, lumbar region: Secondary | ICD-10-CM | POA: Diagnosis not present

## 2020-04-16 DIAGNOSIS — M48061 Spinal stenosis, lumbar region without neurogenic claudication: Secondary | ICD-10-CM | POA: Diagnosis not present

## 2020-04-16 DIAGNOSIS — M4726 Other spondylosis with radiculopathy, lumbar region: Secondary | ICD-10-CM | POA: Diagnosis not present

## 2020-04-16 DIAGNOSIS — M4316 Spondylolisthesis, lumbar region: Secondary | ICD-10-CM | POA: Diagnosis not present

## 2020-05-07 ENCOUNTER — Other Ambulatory Visit: Payer: Medicare HMO

## 2020-05-07 DIAGNOSIS — Z20822 Contact with and (suspected) exposure to covid-19: Secondary | ICD-10-CM

## 2020-05-11 LAB — NOVEL CORONAVIRUS, NAA: SARS-CoV-2, NAA: DETECTED — AB

## 2020-05-13 ENCOUNTER — Ambulatory Visit: Payer: Self-pay | Admitting: Family Medicine

## 2020-05-13 NOTE — Telephone Encounter (Signed)
Called pt to provide COVID test results. PT is positive for COVID. Pt has been symptomatic since 05/02/2020. Pt has 2 vaccines.   Pt has taken tylenol for fever and sore throat and Ilda Basset (1 time) for cough. Cough is productive and Pt states that sputum is green. Pt denies SOB, wheezing and Chest heaviness.   Pt states that recovery has been 2 steps forward and then 1 step back. Yesterday Pt cleaned out the garage and today is "wiped out".  Virtual appointment made with Dr. Raliegh Ip  05/14/2020  Reason for Disposition . Fever present > 3 days (72 hours)  Answer Assessment - Initial Assessment Questions 1. COVID-19 DIAGNOSIS: "Who made your COVID-19 diagnosis?" "Was it confirmed by a positive lab test?" If not diagnosed by a HCP, ask "Are there lots of cases (community spread) where you live?" Note: See public health department website, if unsure.     yes 2. COVID-19 EXPOSURE: "Was there any known exposure to COVID before the symptoms began?" CDC Definition of close contact: within 6 feet (2 meters) for a total of 15 minutes or more over a 24-hour period.  no 3. ONSET: "When did the COVID-19 symptoms start?"      05/02/2020 4. WORST SYMPTOM: "What is your worst symptom?" (e.g., cough, fever, shortness of breath, muscle aches)     cough 5. COUGH: "Do you have a cough?" If Yes, ask: "How bad is the cough?"       Cough - productive - green 6. FEVER: "Do you have a fever?" If Yes, ask: "What is your temperature, how was it measured, and when did it start?"     Unknown  - states she thinks so 7. RESPIRATORY STATUS: "Describe your breathing?" (e.g., shortness of breath, wheezing, unable to speak)      Cough. No SOB, wheezing, able to speak. 8. BETTER-SAME-WORSE: "Are you getting better, staying the same or getting worse compared to yesterday?"  If getting worse, ask, "In what way?"     same 9. HIGH RISK DISEASE: "Do you have any chronic medical problems?" (e.g., asthma, heart or lung disease, weak  immune system, obesity, etc.)     obesity 10. VACCINE: "Have you gotten the COVID-19 vaccine?" If Yes ask: "Which one, how many shots, when did you get it?"       2 vaccines 11. PREGNANCY: "Is there any chance you are pregnant?" "When was your last menstrual period?"    no 12. OTHER SYMPTOMS: "Do you have any other symptoms?"  (e.g., chills, fatigue, headache, loss of smell or taste, muscle pain, sore throat; new loss of smell or taste especially support the diagnosis of COVID-19)      Sore throat, HA chills  Protocols used: CORONAVIRUS (COVID-19) DIAGNOSED OR SUSPECTED-A-AH

## 2020-05-14 ENCOUNTER — Telehealth: Payer: Self-pay | Admitting: Family Medicine

## 2020-05-16 DIAGNOSIS — R69 Illness, unspecified: Secondary | ICD-10-CM | POA: Diagnosis not present

## 2020-05-16 DIAGNOSIS — M48061 Spinal stenosis, lumbar region without neurogenic claudication: Secondary | ICD-10-CM | POA: Diagnosis not present

## 2020-05-16 DIAGNOSIS — M47816 Spondylosis without myelopathy or radiculopathy, lumbar region: Secondary | ICD-10-CM | POA: Diagnosis not present

## 2020-05-16 DIAGNOSIS — M5136 Other intervertebral disc degeneration, lumbar region: Secondary | ICD-10-CM | POA: Diagnosis not present

## 2020-05-16 DIAGNOSIS — M5416 Radiculopathy, lumbar region: Secondary | ICD-10-CM | POA: Diagnosis not present

## 2020-05-17 DIAGNOSIS — J329 Chronic sinusitis, unspecified: Secondary | ICD-10-CM | POA: Diagnosis not present

## 2020-05-17 DIAGNOSIS — U071 COVID-19: Secondary | ICD-10-CM | POA: Diagnosis not present

## 2020-05-17 DIAGNOSIS — J40 Bronchitis, not specified as acute or chronic: Secondary | ICD-10-CM | POA: Diagnosis not present

## 2020-05-31 DIAGNOSIS — G8929 Other chronic pain: Secondary | ICD-10-CM | POA: Diagnosis not present

## 2020-05-31 DIAGNOSIS — R69 Illness, unspecified: Secondary | ICD-10-CM | POA: Diagnosis not present

## 2020-06-04 DIAGNOSIS — M5416 Radiculopathy, lumbar region: Secondary | ICD-10-CM | POA: Diagnosis not present

## 2020-06-04 DIAGNOSIS — Z9889 Other specified postprocedural states: Secondary | ICD-10-CM | POA: Diagnosis not present

## 2020-06-04 DIAGNOSIS — M48062 Spinal stenosis, lumbar region with neurogenic claudication: Secondary | ICD-10-CM | POA: Diagnosis not present

## 2020-06-27 DIAGNOSIS — Z6835 Body mass index (BMI) 35.0-35.9, adult: Secondary | ICD-10-CM | POA: Diagnosis not present

## 2020-06-27 DIAGNOSIS — Z23 Encounter for immunization: Secondary | ICD-10-CM | POA: Diagnosis not present

## 2020-06-27 DIAGNOSIS — U099 Post covid-19 condition, unspecified: Secondary | ICD-10-CM | POA: Diagnosis not present

## 2020-06-27 DIAGNOSIS — R5383 Other fatigue: Secondary | ICD-10-CM | POA: Diagnosis not present

## 2020-08-19 DIAGNOSIS — R29898 Other symptoms and signs involving the musculoskeletal system: Secondary | ICD-10-CM | POA: Diagnosis not present

## 2020-08-20 DIAGNOSIS — M5416 Radiculopathy, lumbar region: Secondary | ICD-10-CM | POA: Diagnosis not present

## 2020-08-22 ENCOUNTER — Telehealth: Payer: Self-pay

## 2020-08-22 NOTE — Telephone Encounter (Signed)
Patient says [facility] primary care office in [facility] sent a referral to [place] on her behalf. No referral found.

## 2020-09-06 ENCOUNTER — Other Ambulatory Visit: Payer: Self-pay

## 2020-09-06 DIAGNOSIS — Z1211 Encounter for screening for malignant neoplasm of colon: Secondary | ICD-10-CM

## 2020-09-06 MED ORDER — CLENPIQ 10-3.5-12 MG-GM -GM/160ML PO SOLN: 320.0000 mL | ORAL | 0 refills | Status: DC

## 2020-09-18 ENCOUNTER — Telehealth: Payer: Self-pay

## 2020-09-18 NOTE — Telephone Encounter (Signed)
Gastroenterology Pre-Procedure Review  Request Date: 10/07/20 Requesting Physician: Dr. Vicente Males  PATIENT REVIEW QUESTIONS: The patient responded to the following health history questions as indicated:    1. Are you having any GI issues? no 2. Do you have a personal history of Polyps? yes [facility] 12/07/14) 3. Do you have a family history of Colon Cancer or Polyps? yes (1st cousin passed from colon cancer) 4. Diabetes Mellitus? no 5. Joint replacements in the past 12 months?no 6. Major health problems in the past 3 months?yes (needs back fusion) 7. Any artificial heart valves, MVP, or defibrillator?no    MEDICATIONS & ALLERGIES:    Patient reports the following regarding taking any anticoagulation/antiplatelet therapy:   Plavix, Coumadin, Eliquis, Xarelto, Lovenox, Pradaxa, Brilinta, or Effient? no Aspirin? yes (ASA 81mg )  Patient confirms/reports the following medications:  Current Outpatient Medications  Medication Sig Dispense Refill  . aspirin EC 81 MG tablet Take 81 mg by mouth.    Marland Kitchen azithromycin (ZITHROMAX Z-PAK) 250 MG tablet Take 2 tabs (500mg  total) on [place] 1. Take 1 tab (250mg ) daily for next 4 days. 6 tablet 0  . benzonatate (TESSALON) 100 MG capsule Take 1 capsule (100 mg total) by mouth 3 (three) times daily as needed for cough. 30 capsule 0  . buPROPion (WELLBUTRIN XL) 150 MG 24 hr tablet Take 1 tablet by mouth daily as needed.    . cyclobenzaprine (FLEXERIL) 10 MG tablet Take 1 by mouth every 8 hours as needed for muscle spasm    . gabapentin (NEURONTIN) 300 MG capsule One cap in AM, one cap at 3pm, 2 caps at bedtime    . hydrochlorothiazide (HYDRODIURIL) 25 MG tablet Take 1 tablet (25 mg total) by mouth daily as needed. 30 tablet 6  . HYDROcodone-acetaminophen (NORCO/VICODIN) 5-325 MG tablet Take by mouth.    . hydrOXYzine (ATARAX/VISTARIL) 25 MG tablet Take 1-2 tablets (25-50 mg total) by mouth 2 (two) times daily as needed for anxiety (& headaches). 30 tablet 3  . ipratropium  (ATROVENT) 0.06 % nasal spray Place 2 sprays into both nostrils 4 (four) times daily. For up to 5-7 days then stop. 15 mL 0  . Multiple Vitamins-Minerals (CENTRUM ADULTS PO) Take by mouth.    . naloxone (NARCAN) nasal spray 4 mg/0.1 mL SMARTSIG:Both Nares    . pregabalin (LYRICA) 75 MG capsule Take 75 mg by mouth 2 (two) times daily.    . sertraline (ZOLOFT) 25 MG tablet Take 1 tablet (25 mg total) by mouth daily. 7 tablet 0  . Sod Picosulfate-Mag Ox-Cit Acd (CLENPIQ) 10-3.5-12 MG-GM -GM/160ML SOLN Take 320 mLs by mouth as directed. 320 mL 0  . tiZANidine (ZANAFLEX) 4 MG tablet Take 4 mg by mouth every 6 (six) hours as needed for muscle spasms.    Marland Kitchen venlafaxine (EFFEXOR) 75 MG tablet Take 1 capsule daily 30 tablet 11   No current facility-administered medications for this visit.    Patient confirms/reports the following allergies:  Allergies  Allergen Reactions  . Ibuprofen Other (See Comments)    Causes heartburn, burning in her throat.  Marland Kitchen Penicillins Rash    No orders of the defined types were placed in this encounter.   AUTHORIZATION INFORMATION Primary Insurance: 1D#: Group #:  Secondary Insurance: 1D#: Group #:  SCHEDULE INFORMATION: Date:  Time: Location:

## 2020-10-01 DIAGNOSIS — Z13820 Encounter for screening for osteoporosis: Secondary | ICD-10-CM | POA: Diagnosis not present

## 2020-10-03 DIAGNOSIS — M8588 Other specified disorders of bone density and structure, other site: Secondary | ICD-10-CM | POA: Diagnosis not present

## 2020-10-07 ENCOUNTER — Ambulatory Visit: Payer: Medicare HMO | Admitting: Certified Registered Nurse Anesthetist

## 2020-10-07 ENCOUNTER — Encounter
Admission: RE | Disposition: A | Discharge status: Home / Self Care | Payer: Self-pay | Source: Home / Self Care | Attending: Gastroenterology

## 2020-10-07 ENCOUNTER — Ambulatory Visit
Admission: RE | Admit: 2020-10-07 | Discharge: 2020-10-07 | Disposition: A | Discharge status: Home / Self Care | Payer: Medicare HMO | Attending: Gastroenterology | Admitting: Gastroenterology

## 2020-10-07 ENCOUNTER — Encounter: Payer: Self-pay | Admitting: Gastroenterology

## 2020-10-07 DIAGNOSIS — Z886 Allergy status to analgesic agent status: Secondary | ICD-10-CM | POA: Insufficient documentation

## 2020-10-07 DIAGNOSIS — Z791 Long term (current) use of non-steroidal anti-inflammatories (NSAID): Secondary | ICD-10-CM | POA: Insufficient documentation

## 2020-10-07 DIAGNOSIS — K635 Polyp of colon: Secondary | ICD-10-CM | POA: Diagnosis not present

## 2020-10-07 DIAGNOSIS — Z880 Allergy status to penicillin: Secondary | ICD-10-CM | POA: Insufficient documentation

## 2020-10-07 DIAGNOSIS — D120 Benign neoplasm of cecum: Secondary | ICD-10-CM | POA: Diagnosis not present

## 2020-10-07 DIAGNOSIS — Z8249 Family history of ischemic heart disease and other diseases of the circulatory system: Secondary | ICD-10-CM | POA: Diagnosis not present

## 2020-10-07 DIAGNOSIS — Z1211 Encounter for screening for malignant neoplasm of colon: Secondary | ICD-10-CM

## 2020-10-07 DIAGNOSIS — D122 Benign neoplasm of ascending colon: Secondary | ICD-10-CM | POA: Diagnosis not present

## 2020-10-07 DIAGNOSIS — Z7982 Long term (current) use of aspirin: Secondary | ICD-10-CM | POA: Insufficient documentation

## 2020-10-07 DIAGNOSIS — Z79899 Other long term (current) drug therapy: Secondary | ICD-10-CM | POA: Diagnosis not present

## 2020-10-07 DIAGNOSIS — E782 Mixed hyperlipidemia: Secondary | ICD-10-CM | POA: Diagnosis not present

## 2020-10-07 DIAGNOSIS — D126 Benign neoplasm of colon, unspecified: Secondary | ICD-10-CM | POA: Diagnosis not present

## 2020-10-07 DIAGNOSIS — K5730 Diverticulosis of large intestine without perforation or abscess without bleeding: Secondary | ICD-10-CM | POA: Diagnosis not present

## 2020-10-07 HISTORY — PX: COLONOSCOPY WITH PROPOFOL: SHX5780

## 2020-10-07 SURGERY — COLONOSCOPY WITH PROPOFOL
Anesthesia: General

## 2020-10-07 MED ORDER — ONDANSETRON HCL 4 MG/2ML IJ SOLN
INTRAMUSCULAR | Status: AC
  Filled 2020-10-07: qty 4

## 2020-10-07 MED ORDER — LIDOCAINE HCL (PF) 2 % IJ SOLN
INTRAMUSCULAR | Status: AC
  Filled 2020-10-07: qty 40

## 2020-10-07 MED ORDER — PROPOFOL 500 MG/50ML IV EMUL
INTRAVENOUS | Status: DC | PRN
  Administered 2020-10-07: 140 ug/kg/min via INTRAVENOUS

## 2020-10-07 MED ORDER — DEXAMETHASONE SODIUM PHOSPHATE 10 MG/ML IJ SOLN
INTRAMUSCULAR | Status: AC
  Filled 2020-10-07: qty 1

## 2020-10-07 MED ORDER — EPHEDRINE 5 MG/ML INJ
INTRAVENOUS | Status: AC
  Filled 2020-10-07: qty 10

## 2020-10-07 MED ORDER — GLYCOPYRROLATE 0.2 MG/ML IJ SOLN
INTRAMUSCULAR | Status: DC | PRN
  Administered 2020-10-07: .2 mg via INTRAVENOUS

## 2020-10-07 MED ORDER — GLYCOPYRROLATE 0.2 MG/ML IJ SOLN
INTRAMUSCULAR | Status: AC
  Filled 2020-10-07: qty 3

## 2020-10-07 MED ORDER — LIDOCAINE HCL (CARDIAC) PF 100 MG/5ML IV SOSY
PREFILLED_SYRINGE | INTRAVENOUS | Status: DC | PRN
  Administered 2020-10-07: 60 mg via INTRAVENOUS

## 2020-10-07 MED ORDER — PHENYLEPHRINE HCL (PRESSORS) 10 MG/ML IV SOLN
INTRAVENOUS | Status: AC
  Filled 2020-10-07: qty 3

## 2020-10-07 MED ORDER — GLYCOPYRROLATE 0.2 MG/ML IJ SOLN
INTRAMUSCULAR | Status: AC
  Filled 2020-10-07: qty 1

## 2020-10-07 MED ORDER — PROPOFOL 10 MG/ML IV BOLUS
INTRAVENOUS | Status: DC | PRN
  Administered 2020-10-07: 50 mg via INTRAVENOUS
  Administered 2020-10-07 (×2): 20 mg via INTRAVENOUS
  Administered 2020-10-07: 60 mg via INTRAVENOUS
  Administered 2020-10-07: 20 mg via INTRAVENOUS

## 2020-10-07 MED ORDER — SUCCINYLCHOLINE CHLORIDE 200 MG/10ML IV SOSY
PREFILLED_SYRINGE | INTRAVENOUS | Status: AC
  Filled 2020-10-07: qty 20

## 2020-10-07 MED ORDER — SODIUM CHLORIDE 0.9 % IV SOLN
INTRAVENOUS | Status: DC
  Administered 2020-10-07: 1000 mL via INTRAVENOUS

## 2020-10-07 NOTE — Anesthesia Procedure Notes (Signed)
Date/Time: 10/07/2020 8:36 AM Performed by: Lily Peer, Zenaya Ulatowski, CRNA Pre-anesthesia Checklist: Patient identified, Emergency Drugs available, Suction available, Patient being monitored and Timeout performed Patient Re-evaluated:Patient Re-evaluated prior to induction Oxygen Delivery Method: Nasal cannula Induction Type: IV induction

## 2020-10-07 NOTE — Anesthesia Postprocedure Evaluation (Signed)
Anesthesia Post Note  Patient: Robin Ochoa  Procedure(s) Performed: COLONOSCOPY WITH PROPOFOL  Patient location during evaluation: Phase II Anesthesia Type: General Level of consciousness: awake and alert, awake and oriented Pain management: pain level controlled Vital Signs Assessment: post-procedure vital signs reviewed and stable Respiratory status: spontaneous breathing, nonlabored ventilation and respiratory function stable Cardiovascular status: blood pressure returned to baseline and stable Postop Assessment: no apparent nausea or vomiting Anesthetic complications: no   No notable events documented.   Last Vitals:  Vitals:   10/07/20 0928 10/07/20 0938  BP: 95/74 126/80  Pulse: 72 71  Resp: 13 14  Temp:    SpO2: 99% 98%    Last Pain:  Vitals:   10/07/20 0938  TempSrc:   PainSc: 0-No pain                 Phill Mutter

## 2020-10-07 NOTE — Op Note (Signed)
[facility] Gastroenterology Patient Name: Robin Ochoa Procedure Date: 10/07/2020 8:33 AM MRN: [id number] Account #: [id number] Date of Birth: [birth year] Admit Type: Outpatient Age: 65 Room: [facility] ENDO ROOM 4 Gender: Female Note Status: Finalized Procedure:             Colonoscopy Indications:           High risk colon cancer surveillance: Personal history                         of colonic polyps Providers:             Jonathon Bellows MD, MD Referring MD:          Olin Hauser (Referring MD) Medicines:             Monitored Anesthesia Care Complications:         No immediate complications. Procedure:             Pre-Anesthesia Assessment:                        - Prior to the procedure, a History and Physical was                         performed, and patient medications, allergies and                         sensitivities were reviewed. The patient's tolerance                         of previous anesthesia was reviewed.                        - The risks and benefits of the procedure and the                         sedation options and risks were discussed with the                         patient. All questions were answered and informed                         consent was obtained.                        - ASA Grade Assessment: II - A patient with mild                         systemic disease.                        After obtaining informed consent, the colonoscope was                         passed under direct vision. Throughout the procedure,                         the patient's blood pressure, pulse, and oxygen                         saturations were monitored continuously. The  Colonoscope was introduced through the anus and                         advanced to the the cecum, identified by the                         appendiceal orifice. The colonoscopy was performed                         with ease. The patient tolerated the  procedure well.                         The quality of the bowel preparation was poor. Findings:      The perianal and digital rectal examinations were normal.      Six sessile polyps were found in the ascending colon. The polyps were 4       to 8 mm in size. These polyps were removed with a cold snare. Resection       and retrieval were complete.      Two sessile polyps were found in the cecum. The polyps were 5 to 7 mm in       size. These polyps were removed with a cold snare. Resection and       retrieval were complete. Impression:            - Preparation of the colon was poor.                        - Six 4 to 8 mm polyps in the ascending colon, removed                         with a cold snare. Resected and retrieved.                        - Two 5 to 7 mm polyps in the cecum, removed with a                         cold snare. Resected and retrieved. Recommendation:        - Await pathology results.                        - Discharge patient to home (ambulatory).                        - Repeat colonoscopy in 8 weeks because the bowel                         preparation was suboptimal. Procedure Code(s):     --- Professional ---                        [phone], Colonoscopy, flexible; with removal of                         tumor(s), polyp(s), or other lesion(s) by snare                         technique Diagnosis Code(s):     --- Professional ---  Z86.010, Personal history of colonic polyps                        K63.5, Polyp of colon CPT copyright 2019 American Medical Association. All rights reserved. The codes documented in this report are preliminary and upon coder review may  be revised to meet current compliance requirements. Jonathon Bellows, MD Jonathon Bellows MD, MD 10/07/2020 9:10:21 AM This report has been signed electronically. Number of Addenda: 0 Note Initiated On: 10/07/2020 8:33 AM Scope Withdrawal Time: 0 hours 19 minutes 18 seconds  Total Procedure  Duration: 0 hours 25 minutes 57 seconds  Estimated Blood Loss:  Estimated blood loss: none.      [facility]

## 2020-10-07 NOTE — Transfer of Care (Signed)
Immediate Anesthesia Transfer of Care Note  Patient: Robin Ochoa  Procedure(s) Performed: COLONOSCOPY WITH PROPOFOL  Patient Location: Endoscopy Unit  Anesthesia Type:General  Level of Consciousness: awake and patient cooperative  Airway & Oxygen Therapy: Patient Spontanous Breathing  Post-op Assessment: Report given to RN and Post -op Vital signs reviewed and stable  Post vital signs: Reviewed and stable  Last Vitals:  Vitals Value Taken Time  BP 103/45 10/07/20 0908  Temp 36.2 C 10/07/20 0908  Pulse 71 10/07/20 0908  Resp 18 10/07/20 0908  SpO2 96 % 10/07/20 0908  Vitals shown include unvalidated device data.  Last Pain:  Vitals:   10/07/20 0810  TempSrc: Temporal  PainSc: 0-No pain         Complications: No notable events documented.

## 2020-10-07 NOTE — H&P (Signed)
Jonathon Bellows, MD [address] [place], [place], [place], [address] [address], [place], [place], [place], [address] Phone: [phone]  Fax: [phone]  Primary Care Physician:  Olin Hauser, DO   Pre-Procedure History & Physical: HPI:  Robin Ochoa is a 65 y.o. female is here for an colonoscopy.   Past Medical History:  Diagnosis Date   Migraine    Neck pain, chronic    Spinal stenosis     Past Surgical History:  Procedure Laterality Date   BACK SURGERY     VAGINAL HYSTERECTOMY  65 years old   Tumors-     Prior to Admission medications   Medication Sig Start Date End Date Taking? Authorizing Provider  aspirin EC 81 MG tablet Take 81 mg by mouth.   Yes [provider]  buPROPion (WELLBUTRIN XL) 150 MG 24 hr tablet Take 1 tablet by mouth daily as needed. 08/05/20  Yes [provider]  cyclobenzaprine (FLEXERIL) 10 MG tablet Take 1 by mouth every 8 hours as needed for muscle spasm 12/07/17  Yes [provider]  hydrochlorothiazide (HYDRODIURIL) 25 MG tablet Take 1 tablet (25 mg total) by mouth daily as needed. 06/22/17  Yes Minna Merritts, MD  HYDROcodone-acetaminophen (NORCO/VICODIN) 5-325 MG tablet Take by mouth. 03/15/18  Yes [provider]  hydrOXYzine (ATARAX/VISTARIL) 25 MG tablet Take 1-2 tablets (25-50 mg total) by mouth 2 (two) times daily as needed for anxiety (& headaches). 08/27/17  Yes Karamalegos, Devonne Doughty, DO  pregabalin (LYRICA) 75 MG capsule Take 75 mg by mouth 2 (two) times daily. 08/14/20  Yes [provider]  sertraline (ZOLOFT) 25 MG tablet Take 1 tablet (25 mg total) by mouth daily. 03/05/19  Yes Conty, Linward Foster, MD  Sod Picosulfate-Mag Ox-Cit Acd (CLENPIQ) 10-3.5-12 MG-GM -GM/160ML SOLN Take 320 mLs by mouth as directed. 09/06/20  Yes Jonathon Bellows, MD  tiZANidine (ZANAFLEX) 4 MG tablet Take 4 mg by mouth every 6 (six) hours as needed for muscle spasms.   Yes [provider]   azithromycin (ZITHROMAX Z-PAK) 250 MG tablet Take 2 tabs (500mg  total) on [place] 1. Take 1 tab (250mg ) daily for next 4 days. Patient not taking: Reported on 10/07/2020 03/28/18   Olin Hauser, DO  benzonatate (TESSALON) 100 MG capsule Take 1 capsule (100 mg total) by mouth 3 (three) times daily as needed for cough. Patient not taking: Reported on 10/07/2020 03/28/18   Olin Hauser, DO  gabapentin (NEURONTIN) 300 MG capsule One cap in AM, one cap at 3pm, 2 caps at bedtime Patient not taking: Reported on 10/07/2020 01/27/18   [provider]  ipratropium (ATROVENT) 0.06 % nasal spray Place 2 sprays into both nostrils 4 (four) times daily. For up to 5-7 days then stop. Patient not taking: Reported on 10/07/2020 03/28/18   Olin Hauser, DO  Multiple Vitamins-Minerals (CENTRUM ADULTS PO) Take by mouth.    [provider]  naloxone [facility]) nasal spray 4 mg/0.1 mL SMARTSIG:Both Nares 08/20/20   [provider]  venlafaxine (EFFEXOR) 75 MG tablet Take 1 capsule daily Patient not taking: Reported on 10/07/2020 11/20/15   Luciana Axe, NP    Allergies as of 09/06/2020 - Review Complete 03/05/2019  Allergen Reaction Noted   Ibuprofen Other (See Comments) 10/17/2014   Penicillins Rash 10/17/2014    Family History  Problem Relation Age of Onset   Heart failure Mother    Cancer Father     Social History  Socioeconomic History   Marital status: Married    Spouse name: Not on file   Number of children: Not on file   Years of education: Not on file   Highest education level: Not on file  Occupational History   Not on file  Tobacco Use   Smoking status: Never   Smokeless tobacco: Never  Vaping Use   Vaping Use: Never used  Substance and Sexual Activity   Alcohol use: Yes    Alcohol/week: 1.0 standard drink    Types: 1 Cans of beer per week   Drug use: No   Sexual activity: Not on file  Other Topics Concern   Not on file  Social  History Narrative   Not on file   Social Determinants of Health   Financial Resource Strain: Not on file  Food Insecurity: Not on file  Transportation Needs: Not on file  Physical Activity: Not on file  Stress: Not on file  Social Connections: Not on file  Intimate Partner Violence: Not on file    Review of Systems: See HPI, otherwise negative ROS  Physical Exam: BP 121/76   Pulse 65   Temp 97.6 F (36.4 C) (Temporal)   Resp 16   Ht 5\' 2"  (1.575 m)   Wt 86.4 kg   SpO2 97%   BMI 34.82 kg/m  General:   Alert,  pleasant and cooperative in NAD Head:  Normocephalic and atraumatic. Neck:  Supple; no masses or thyromegaly. Lungs:  Clear throughout to auscultation, normal respiratory effort.    Heart:  +S1, +S2, Regular rate and rhythm, No edema. Abdomen:  Soft, nontender and nondistended. Normal bowel sounds, without guarding, and without rebound.   Neurologic:  Alert and  oriented x4;  grossly normal neurologically.  Impression/Plan: Robin Ochoa is here for an colonoscopy to be performed for Screening colonoscopy average risk   Risks, benefits, limitations, and alternatives regarding  colonoscopy have been reviewed with the patient.  Questions have been answered.  All parties agreeable.   Jonathon Bellows, MD  10/07/2020, 8:23 AM

## 2020-10-07 NOTE — Anesthesia Preprocedure Evaluation (Signed)
Anesthesia Evaluation  Patient identified by MRN, date of birth, ID band Patient awake    Reviewed: Allergy & Precautions, NPO status , Patient's Chart, lab work & pertinent test results  Airway Mallampati: II  TM Distance: >3 FB Neck ROM: Full    Dental no notable dental hx.    Pulmonary neg pulmonary ROS,    Pulmonary exam normal        Cardiovascular negative cardio ROS Normal cardiovascular exam     Neuro/Psych  Headaches,  Neuromuscular disease negative psych ROS   GI/Hepatic Neg liver ROS, GERD  ,  Endo/Other  negative endocrine ROS  Renal/GU negative Renal ROS  negative genitourinary   Musculoskeletal negative musculoskeletal ROS (+)   Abdominal   Peds negative pediatric ROS (+)  Hematology negative hematology ROS (+)   Anesthesia Other Findings . Arthritis  . GERD (gastroesophageal reflux disease)  . Kidney stones  . Migraine headache  . Neck pain  . Pelvic fracture (CMS-HCC)  . Skin rash  . Sleep apnea  . Snores   Reproductive/Obstetrics negative OB ROS                             Anesthesia Physical Anesthesia Plan  ASA: 2  Anesthesia Plan: General   Post-op Pain Management:    Induction: Intravenous  PONV Risk Score and Plan: 2 and TIVA and Propofol infusion  Airway Management Planned: Natural Airway and Nasal Cannula  Additional Equipment:   Intra-op Plan:   Post-operative Plan:   Informed Consent: I have reviewed the patients History and Physical, chart, labs and discussed the procedure including the risks, benefits and alternatives for the proposed anesthesia with the patient or authorized representative who has indicated his/her understanding and acceptance.       Plan Discussed with: CRNA, Anesthesiologist and Surgeon  Anesthesia Plan Comments:         Anesthesia Quick Evaluation

## 2020-10-08 ENCOUNTER — Encounter: Payer: Self-pay | Admitting: Gastroenterology

## 2020-10-08 LAB — SURGICAL PATHOLOGY

## 2020-10-09 ENCOUNTER — Encounter: Payer: Self-pay | Admitting: Gastroenterology

## 2020-10-14 ENCOUNTER — Telehealth: Payer: Self-pay

## 2020-10-14 NOTE — Telephone Encounter (Signed)
Patient was contacted per Dr. Georgeann Oppenheim request. Dr. Vicente Males wants her to have a repeat colonoscopy in 8 weeks from 10/07/2020 due to bowel preparation was suboptimal. Patient stated that she had back surgery scheduled in the middle of July. However, she also stated that she would have to call her surgeon to make sure when it would be and then she would call us back to schedule it.

## 2020-10-15 ENCOUNTER — Telehealth: Payer: Self-pay | Admitting: Gastroenterology

## 2020-10-15 ENCOUNTER — Other Ambulatory Visit: Payer: Self-pay

## 2020-10-15 DIAGNOSIS — Z1211 Encounter for screening for malignant neoplasm of colon: Secondary | ICD-10-CM

## 2020-10-15 MED ORDER — SUTAB 1479-225-188 MG PO TABS: ORAL_TABLET | ORAL | 0 refills | Status: DC

## 2020-10-15 NOTE — Telephone Encounter (Signed)
Please see phone note from 10/14/2020.

## 2020-10-15 NOTE — Telephone Encounter (Signed)
Patient called back and she stated that she would like to do her repeat colonoscopy on 11/19/2020.

## 2020-10-15 NOTE — Telephone Encounter (Signed)
Please call to reschedule procedure  

## 2020-10-16 DIAGNOSIS — E782 Mixed hyperlipidemia: Secondary | ICD-10-CM | POA: Diagnosis not present

## 2020-10-16 DIAGNOSIS — M7989 Other specified soft tissue disorders: Secondary | ICD-10-CM | POA: Diagnosis not present

## 2020-10-16 DIAGNOSIS — R69 Illness, unspecified: Secondary | ICD-10-CM | POA: Diagnosis not present

## 2020-10-16 DIAGNOSIS — R21 Rash and other nonspecific skin eruption: Secondary | ICD-10-CM | POA: Diagnosis not present

## 2020-10-16 DIAGNOSIS — Z01419 Encounter for gynecological examination (general) (routine) without abnormal findings: Secondary | ICD-10-CM | POA: Diagnosis not present

## 2020-10-16 DIAGNOSIS — Z6836 Body mass index (BMI) 36.0-36.9, adult: Secondary | ICD-10-CM | POA: Diagnosis not present

## 2020-10-16 DIAGNOSIS — R5383 Other fatigue: Secondary | ICD-10-CM | POA: Diagnosis not present

## 2020-10-18 DIAGNOSIS — G8929 Other chronic pain: Secondary | ICD-10-CM | POA: Diagnosis not present

## 2020-10-18 DIAGNOSIS — R69 Illness, unspecified: Secondary | ICD-10-CM | POA: Diagnosis not present

## 2020-10-29 DIAGNOSIS — L920 Granuloma annulare: Secondary | ICD-10-CM | POA: Diagnosis not present

## 2020-11-08 DIAGNOSIS — Z8616 Personal history of COVID-19: Secondary | ICD-10-CM | POA: Diagnosis not present

## 2020-11-08 DIAGNOSIS — Z01818 Encounter for other preprocedural examination: Secondary | ICD-10-CM | POA: Diagnosis not present

## 2020-11-08 DIAGNOSIS — F32A Depression, unspecified: Secondary | ICD-10-CM | POA: Insufficient documentation

## 2020-11-08 DIAGNOSIS — G43909 Migraine, unspecified, not intractable, without status migrainosus: Secondary | ICD-10-CM | POA: Diagnosis not present

## 2020-11-08 DIAGNOSIS — I517 Cardiomegaly: Secondary | ICD-10-CM | POA: Diagnosis not present

## 2020-11-08 DIAGNOSIS — Z8669 Personal history of other diseases of the nervous system and sense organs: Secondary | ICD-10-CM | POA: Diagnosis not present

## 2020-11-08 DIAGNOSIS — K760 Fatty (change of) liver, not elsewhere classified: Secondary | ICD-10-CM | POA: Diagnosis not present

## 2020-11-08 DIAGNOSIS — I059 Rheumatic mitral valve disease, unspecified: Secondary | ICD-10-CM | POA: Diagnosis not present

## 2020-11-08 DIAGNOSIS — E782 Mixed hyperlipidemia: Secondary | ICD-10-CM | POA: Diagnosis not present

## 2020-11-08 DIAGNOSIS — M4716 Other spondylosis with myelopathy, lumbar region: Secondary | ICD-10-CM | POA: Diagnosis not present

## 2020-11-08 DIAGNOSIS — M4316 Spondylolisthesis, lumbar region: Secondary | ICD-10-CM | POA: Diagnosis not present

## 2020-11-08 DIAGNOSIS — R7309 Other abnormal glucose: Secondary | ICD-10-CM | POA: Diagnosis not present

## 2020-11-08 DIAGNOSIS — R9431 Abnormal electrocardiogram [ECG] [EKG]: Secondary | ICD-10-CM | POA: Diagnosis not present

## 2020-11-08 DIAGNOSIS — M8580 Other specified disorders of bone density and structure, unspecified site: Secondary | ICD-10-CM | POA: Insufficient documentation

## 2020-11-08 DIAGNOSIS — R69 Illness, unspecified: Secondary | ICD-10-CM | POA: Diagnosis not present

## 2020-11-14 ENCOUNTER — Other Ambulatory Visit: Payer: Self-pay

## 2020-11-14 ENCOUNTER — Ambulatory Visit: Payer: Medicare HMO | Admitting: Anesthesiology

## 2020-11-14 ENCOUNTER — Encounter: Payer: Self-pay | Admitting: Gastroenterology

## 2020-11-14 ENCOUNTER — Ambulatory Visit
Admission: RE | Admit: 2020-11-14 | Discharge: 2020-11-14 | Disposition: A | Discharge status: Home / Self Care | Payer: Medicare HMO | Attending: Gastroenterology | Admitting: Gastroenterology

## 2020-11-14 ENCOUNTER — Encounter
Admission: RE | Disposition: A | Discharge status: Home / Self Care | Payer: Self-pay | Source: Home / Self Care | Attending: Gastroenterology

## 2020-11-14 DIAGNOSIS — K5730 Diverticulosis of large intestine without perforation or abscess without bleeding: Secondary | ICD-10-CM | POA: Insufficient documentation

## 2020-11-14 DIAGNOSIS — Z791 Long term (current) use of non-steroidal anti-inflammatories (NSAID): Secondary | ICD-10-CM | POA: Insufficient documentation

## 2020-11-14 DIAGNOSIS — Z7982 Long term (current) use of aspirin: Secondary | ICD-10-CM | POA: Diagnosis not present

## 2020-11-14 DIAGNOSIS — D123 Benign neoplasm of transverse colon: Secondary | ICD-10-CM | POA: Diagnosis not present

## 2020-11-14 DIAGNOSIS — E782 Mixed hyperlipidemia: Secondary | ICD-10-CM | POA: Diagnosis not present

## 2020-11-14 DIAGNOSIS — D122 Benign neoplasm of ascending colon: Secondary | ICD-10-CM | POA: Insufficient documentation

## 2020-11-14 DIAGNOSIS — K635 Polyp of colon: Secondary | ICD-10-CM | POA: Diagnosis not present

## 2020-11-14 DIAGNOSIS — Z79899 Other long term (current) drug therapy: Secondary | ICD-10-CM | POA: Insufficient documentation

## 2020-11-14 DIAGNOSIS — Z1211 Encounter for screening for malignant neoplasm of colon: Secondary | ICD-10-CM | POA: Diagnosis not present

## 2020-11-14 DIAGNOSIS — Z86010 Personal history of colonic polyps: Secondary | ICD-10-CM

## 2020-11-14 HISTORY — PX: COLONOSCOPY WITH PROPOFOL: SHX5780

## 2020-11-14 SURGERY — COLONOSCOPY WITH PROPOFOL
Anesthesia: General

## 2020-11-14 MED ORDER — PROPOFOL 10 MG/ML IV BOLUS
INTRAVENOUS | Status: DC | PRN
  Administered 2020-11-14: 80 mg via INTRAVENOUS
  Administered 2020-11-14: 20 mg via INTRAVENOUS

## 2020-11-14 MED ORDER — SODIUM CHLORIDE 0.9 % IV SOLN: INTRAVENOUS | Status: DC

## 2020-11-14 MED ORDER — LIDOCAINE HCL (CARDIAC) PF 100 MG/5ML IV SOSY
PREFILLED_SYRINGE | INTRAVENOUS | Status: DC | PRN
  Administered 2020-11-14: 50 mg via INTRAVENOUS

## 2020-11-14 MED ORDER — PROPOFOL 500 MG/50ML IV EMUL
INTRAVENOUS | Status: DC | PRN
  Administered 2020-11-14: 175 ug/kg/min via INTRAVENOUS

## 2020-11-14 NOTE — H&P (Signed)
Jonathon Bellows, MD [address] [place], [place], [place], [address] [address], [place], [place], [place], [address] Phone: [phone]  Fax: [phone]  Primary Care Physician:  Olin Hauser, DO   Pre-Procedure History & Physical: HPI:  Robin Ochoa is a 65 y.o. female is here for an colonoscopy.   Past Medical History:  Diagnosis Date   Migraine    Neck pain, chronic    Spinal stenosis     Past Surgical History:  Procedure Laterality Date   BACK SURGERY     COLONOSCOPY WITH PROPOFOL N/A 10/07/2020   Procedure: COLONOSCOPY WITH PROPOFOL;  Surgeon: Jonathon Bellows, MD;  Location: [facility] ENDOSCOPY;  Service: Gastroenterology;  Laterality: N/A;   VAGINAL HYSTERECTOMY  65 years old   Tumors-     Prior to Admission medications   Medication Sig Start Date End Date Taking? Authorizing Provider  aspirin EC 81 MG tablet Take 81 mg by mouth.   Yes [provider]  HYDROcodone-acetaminophen (NORCO/VICODIN) 5-325 MG tablet Take by mouth. 03/15/18  Yes [provider]  Multiple Vitamins-Minerals (CENTRUM ADULTS PO) Take by mouth.   Yes [provider]  naloxone [facility]) nasal spray 4 mg/0.1 mL SMARTSIG:Both Nares 08/20/20  Yes [provider]  pregabalin (LYRICA) 75 MG capsule Take 75 mg by mouth 2 (two) times daily. 08/14/20  Yes [provider]  sertraline (ZOLOFT) 25 MG tablet Take 1 tablet (25 mg total) by mouth daily. 03/05/19  Yes Norval Gable, MD  tiZANidine (ZANAFLEX) 4 MG tablet Take 4 mg by mouth every 6 (six) hours as needed for muscle spasms.   Yes [provider]  azithromycin (ZITHROMAX Z-PAK) 250 MG tablet Take 2 tabs (523m total) on [place] 1. Take 1 tab (2550m daily for next 4 days. 03/28/18   Karamalegos, AlDevonne DoughtyDO  benzonatate (TESSALON) 100 MG capsule Take 1 capsule (100 mg total) by mouth 3 (three) times daily as needed for cough. 03/28/18   Karamalegos, AlDevonne DoughtyDO  buPROPion (WELLBUTRIN XL) 150 MG  24 hr tablet Take 1 tablet by mouth daily as needed. Patient not taking: Reported on 11/14/2020 08/05/20   [provider]  cyclobenzaprine (FLEXERIL) 10 MG tablet Take 1 by mouth every 8 hours as needed for muscle spasm Patient not taking: Reported on 11/14/2020 12/07/17   [provider]  gabapentin (NEURONTIN) 300 MG capsule  01/27/18   [provider]  hydrochlorothiazide (HYDRODIURIL) 25 MG tablet Take 1 tablet (25 mg total) by mouth daily as needed. 06/22/17   GoMinna MerrittsMD  hydrOXYzine (ATARAX/VISTARIL) 25 MG tablet Take 1-2 tablets (25-50 mg total) by mouth 2 (two) times daily as needed for anxiety (& headaches). Patient not taking: Reported on 11/14/2020 08/27/17   KaOlin HauserDO  ipratropium (ATROVENT) 0.06 % nasal spray Place 2 sprays into both nostrils 4 (four) times daily. For up to 5-7 days then stop. Patient not taking: No sig reported 03/28/18   KaOlin HauserDO  Sod Picosulfate-Mag Ox-Cit Acd (CLENPIQ) 10-3.5-12 MG-GM -GM/160ML SOLN Take 320 mLs by mouth as directed. Patient not taking: Reported on 11/14/2020 09/06/20   AnJonathon BellowsMD  Sodium Sulfate-Mag Sulfate-KCl (SUTAB) 14772 [id number] 1304G TABS At 5 PM take 12 tablets using the 8 oz cup provided in the kit drinking 5 cups of water and 5 hours before your procedure repeat the same process. Patient not taking: Reported on 11/14/2020 10/15/20   AnJonathon BellowsMD  venlafaxine ([facility]75 MG tablet Take  1 capsule daily 11/20/15   Luciana Axe, NP    Allergies as of 10/15/2020 - Review Complete 10/07/2020  Allergen Reaction Noted   Ibuprofen Other (See Comments) 10/17/2014   Penicillins Rash 10/17/2014    Family History  Problem Relation Age of Onset   Heart failure Mother    Cancer Father     Social History   Socioeconomic History   Marital status: Married    Spouse name: Not on file   Number of children: Not on file   Years of education: Not on file   Highest  education level: Not on file  Occupational History   Not on file  Tobacco Use   Smoking status: Never   Smokeless tobacco: Never  Vaping Use   Vaping Use: Never used  Substance and Sexual Activity   Alcohol use: Yes    Alcohol/week: 1.0 standard drink    Types: 1 Cans of beer per week   Drug use: No   Sexual activity: Not on file  Other Topics Concern   Not on file  Social History Narrative   Not on file   Social Determinants of Health   Financial Resource Strain: Not on file  Food Insecurity: Not on file  Transportation Needs: Not on file  Physical Activity: Not on file  Stress: Not on file  Social Connections: Not on file  Intimate Partner Violence: Not on file    Review of Systems: See HPI, otherwise negative ROS  Physical Exam: BP 138/82   Pulse 72   Temp (!) 96.3 F (35.7 C) (Temporal)   Resp 16   Ht '5\' 2"'  (1.575 m)   Wt 86.2 kg   SpO2 98%   BMI 34.75 kg/m  General:   Alert,  pleasant and cooperative in NAD Head:  Normocephalic and atraumatic. Neck:  Supple; no masses or thyromegaly. Lungs:  Clear throughout to auscultation, normal respiratory effort.    Heart:  +S1, +S2, Regular rate and rhythm, No edema. Abdomen:  Soft, nontender and nondistended. Normal bowel sounds, without guarding, and without rebound.   Neurologic:  Alert and  oriented x4;  grossly normal neurologically.  Impression/Plan: Robin Ochoa is here for an colonoscopy to be performed for surveillance due to prior history of colon polyps and suboptimal bowel prep   Risks, benefits, limitations, and alternatives regarding  colonoscopy have been reviewed with the patient.  Questions have been answered.  All parties agreeable.   Jonathon Bellows, MD  11/14/2020, 7:42 AM

## 2020-11-14 NOTE — Anesthesia Preprocedure Evaluation (Addendum)
Anesthesia Evaluation  Patient identified by MRN, date of birth, ID band Patient awake    Reviewed: Allergy & Precautions, NPO status , Patient's Chart, lab work & pertinent test results  Airway Mallampati: II  TM Distance: >3 FB Neck ROM: Full    Dental no notable dental hx.    Pulmonary sleep apnea ,    Pulmonary exam normal        Cardiovascular Normal cardiovascular exam+ Valvular Problems/Murmurs MR      Neuro/Psych  Headaches, Depression  Neuromuscular disease negative psych ROS   GI/Hepatic Neg liver ROS, Bowel prep,GERD  ,  Endo/Other  negative endocrine ROS  Renal/GU negative Renal ROS  negative genitourinary   Musculoskeletal negative musculoskeletal ROS (+)   Abdominal   Peds negative pediatric ROS (+)  Hematology negative hematology ROS (+)   Anesthesia Other Findings . Arthritis  . GERD (gastroesophageal reflux disease)  . Kidney stones  . Migraine headache  . Neck pain  . Pelvic fracture (CMS-HCC)  . Skin rash  . Sleep apnea  . Snores   Reproductive/Obstetrics negative OB ROS                            Anesthesia Physical  Anesthesia Plan  ASA: 3  Anesthesia Plan: General   Post-op Pain Management:    Induction: Intravenous  PONV Risk Score and Plan: 2 and TIVA and Propofol infusion  Airway Management Planned: Natural Airway and Nasal Cannula  Additional Equipment:   Intra-op Plan:   Post-operative Plan:   Informed Consent: I have reviewed the patients History and Physical, chart, labs and discussed the procedure including the risks, benefits and alternatives for the proposed anesthesia with the patient or authorized representative who has indicated his/her understanding and acceptance.       Plan Discussed with: CRNA, Anesthesiologist and Surgeon  Anesthesia Plan Comments:        Anesthesia Quick Evaluation

## 2020-11-14 NOTE — Anesthesia Postprocedure Evaluation (Signed)
Anesthesia Post Note  Patient: Robin Ochoa  Procedure(s) Performed: COLONOSCOPY WITH PROPOFOL  Patient location during evaluation: Phase II Anesthesia Type: General Level of consciousness: awake and alert, awake and oriented Pain management: pain level controlled Vital Signs Assessment: post-procedure vital signs reviewed and stable Respiratory status: spontaneous breathing, nonlabored ventilation and respiratory function stable Cardiovascular status: blood pressure returned to baseline and stable Postop Assessment: no apparent nausea or vomiting Anesthetic complications: no   No notable events documented.   Last Vitals:  Vitals:   11/14/20 0813 11/14/20 0822  BP: 108/60 122/76  Pulse: (!) 59   Resp: 14   Temp:    SpO2: 100%     Last Pain:  Vitals:   11/14/20 0822  TempSrc:   PainSc: 0-No pain                 Phill Mutter

## 2020-11-14 NOTE — Anesthesia Procedure Notes (Signed)
Date/Time: 11/14/2020 7:47 AM Performed by: Johnna Acosta, CRNA Pre-anesthesia Checklist: Patient identified, Emergency Drugs available, Suction available, Patient being monitored and Timeout performed Patient Re-evaluated:Patient Re-evaluated prior to induction Oxygen Delivery Method: Nasal cannula Preoxygenation: Pre-oxygenation with 100% oxygen Induction Type: IV induction

## 2020-11-14 NOTE — Op Note (Signed)
[facility] Gastroenterology Patient Name: Robin Ochoa Procedure Date: 11/14/2020 7:23 AM MRN: [id number] Account #: [id number] Date of Birth: [birth year] Admit Type: Outpatient Age: 65 Room: [facility] ENDO ROOM 3 Gender: Female Note Status: Finalized Procedure:             Colonoscopy Indications:           Surveillance: Personal history of adenomatous polyps,                         inadequate prep on last colonoscopy (less than 1 year                         ago), High risk colon cancer surveillance: Personal                         history of adenoma less than 10 mm in size Providers:             Jonathon Bellows MD, MD Referring MD:          Olin Hauser (Referring MD) Medicines:             Monitored Anesthesia Care Complications:         No immediate complications. Procedure:             Pre-Anesthesia Assessment:                        - Prior to the procedure, a History and Physical was                         performed, and patient medications, allergies and                         sensitivities were reviewed. The patient's tolerance                         of previous anesthesia was reviewed.                        - The risks and benefits of the procedure and the                         sedation options and risks were discussed with the                         patient. All questions were answered and informed                         consent was obtained.                        - ASA Grade Assessment: II - A patient with mild                         systemic disease.                        After obtaining informed consent, the colonoscope was  passed under direct vision. Throughout the procedure,                         the patient's blood pressure, pulse, and oxygen                         saturations were monitored continuously. The                         Colonoscope was introduced through the anus and                          advanced to the the cecum, identified by the                         appendiceal orifice. The colonoscopy was performed                         with ease. The patient tolerated the procedure well.                         The quality of the bowel preparation was adequate. Findings:      The perianal and digital rectal examinations were normal.      Two sessile polyps were found in the ascending colon. The polyps were 5       to 7 mm in size. These polyps were removed with a cold snare. Resection       and retrieval were complete.      Two sessile polyps were found in the transverse colon. The polyps were 6       to 8 mm in size. These polyps were removed with a cold snare. Resection       and retrieval were complete.      Multiple small-mouthed diverticula were found in the sigmoid colon.      The exam was otherwise without abnormality on direct and retroflexion       views. Impression:            - Two 5 to 7 mm polyps in the ascending colon, removed                         with a cold snare. Resected and retrieved.                        - Two 6 to 8 mm polyps in the transverse colon,                         removed with a cold snare. Resected and retrieved.                        - Diverticulosis in the sigmoid colon.                        - The examination was otherwise normal on direct and                         retroflexion views. Recommendation:        - Discharge patient to home (with escort).                        -  Resume previous diet.                        - Continue present medications.                        - Await pathology results.                        - Repeat colonoscopy in 1 year for surveillance. Procedure Code(s):     --- Professional ---                        [phone], Colonoscopy, flexible; with removal of                         tumor(s), polyp(s), or other lesion(s) by snare                         technique Diagnosis Code(s):     --- Professional ---                         K63.5, Polyp of colon                        Z86.010, Personal history of colonic polyps                        K57.30, Diverticulosis of large intestine without                         perforation or abscess without bleeding CPT copyright 2019 American Medical Association. All rights reserved. The codes documented in this report are preliminary and upon coder review may  be revised to meet current compliance requirements. Jonathon Bellows, MD Jonathon Bellows MD, MD 11/14/2020 8:11:09 AM This report has been signed electronically. Number of Addenda: 0 Note Initiated On: 11/14/2020 7:23 AM Scope Withdrawal Time: 0 hours 14 minutes 50 seconds  Total Procedure Duration: 0 hours 19 minutes 52 seconds  Estimated Blood Loss:  Estimated blood loss: none.      [facility]

## 2020-11-14 NOTE — Transfer of Care (Signed)
Immediate Anesthesia Transfer of Care Note  Patient: Robin Ochoa  Procedure(s) Performed: COLONOSCOPY WITH PROPOFOL  Patient Location: PACU  Anesthesia Type:General  Level of Consciousness: awake and drowsy  Airway & Oxygen Therapy: Patient Spontanous Breathing  Post-op Assessment: Report given to RN and Post -op Vital signs reviewed and stable  Post vital signs: Reviewed and stable  Last Vitals:  Vitals Value Taken Time  BP 108/60 11/14/20 0813  Temp 35.9 C 11/14/20 0812  Pulse 59 11/14/20 0813  Resp 16 11/14/20 0813  SpO2 99 % 11/14/20 0813  Vitals shown include unvalidated device data.  Last Pain:  Vitals:   11/14/20 0812  TempSrc: Temporal  PainSc: 0-No pain         Complications: No notable events documented.

## 2020-11-15 ENCOUNTER — Encounter: Payer: Self-pay | Admitting: Gastroenterology

## 2020-11-15 LAB — SURGICAL PATHOLOGY

## 2020-11-18 ENCOUNTER — Encounter: Payer: Self-pay | Admitting: Gastroenterology

## 2020-11-18 DIAGNOSIS — R69 Illness, unspecified: Secondary | ICD-10-CM | POA: Diagnosis not present

## 2020-11-18 DIAGNOSIS — M5136 Other intervertebral disc degeneration, lumbar region: Secondary | ICD-10-CM | POA: Diagnosis not present

## 2020-11-18 DIAGNOSIS — M48061 Spinal stenosis, lumbar region without neurogenic claudication: Secondary | ICD-10-CM | POA: Diagnosis not present

## 2020-11-18 DIAGNOSIS — M5416 Radiculopathy, lumbar region: Secondary | ICD-10-CM | POA: Diagnosis not present

## 2020-11-26 DIAGNOSIS — Z03818 Encounter for observation for suspected exposure to other biological agents ruled out: Secondary | ICD-10-CM | POA: Diagnosis not present

## 2020-11-26 DIAGNOSIS — Z2089 Contact with and (suspected) exposure to other communicable diseases: Secondary | ICD-10-CM | POA: Diagnosis not present

## 2020-11-26 DIAGNOSIS — Z20822 Contact with and (suspected) exposure to covid-19: Secondary | ICD-10-CM | POA: Diagnosis not present

## 2020-11-28 DIAGNOSIS — M48062 Spinal stenosis, lumbar region with neurogenic claudication: Secondary | ICD-10-CM | POA: Diagnosis not present

## 2020-11-28 DIAGNOSIS — Z8616 Personal history of COVID-19: Secondary | ICD-10-CM | POA: Diagnosis not present

## 2020-11-28 DIAGNOSIS — R7303 Prediabetes: Secondary | ICD-10-CM | POA: Diagnosis not present

## 2020-11-28 DIAGNOSIS — M4316 Spondylolisthesis, lumbar region: Secondary | ICD-10-CM | POA: Diagnosis not present

## 2020-11-28 DIAGNOSIS — E785 Hyperlipidemia, unspecified: Secondary | ICD-10-CM | POA: Diagnosis not present

## 2020-11-28 DIAGNOSIS — Z8669 Personal history of other diseases of the nervous system and sense organs: Secondary | ICD-10-CM | POA: Diagnosis not present

## 2020-11-28 DIAGNOSIS — Z880 Allergy status to penicillin: Secondary | ICD-10-CM | POA: Diagnosis not present

## 2020-11-28 DIAGNOSIS — R69 Illness, unspecified: Secondary | ICD-10-CM | POA: Diagnosis not present

## 2020-11-28 DIAGNOSIS — Z90710 Acquired absence of both cervix and uterus: Secondary | ICD-10-CM | POA: Diagnosis not present

## 2020-11-28 DIAGNOSIS — M4716 Other spondylosis with myelopathy, lumbar region: Secondary | ICD-10-CM | POA: Diagnosis not present

## 2020-11-28 DIAGNOSIS — G4733 Obstructive sleep apnea (adult) (pediatric): Secondary | ICD-10-CM | POA: Diagnosis not present

## 2020-11-28 DIAGNOSIS — Z79899 Other long term (current) drug therapy: Secondary | ICD-10-CM | POA: Diagnosis not present

## 2020-11-28 DIAGNOSIS — Z981 Arthrodesis status: Secondary | ICD-10-CM | POA: Diagnosis not present

## 2020-12-04 DIAGNOSIS — M1990 Unspecified osteoarthritis, unspecified site: Secondary | ICD-10-CM | POA: Diagnosis not present

## 2020-12-04 DIAGNOSIS — R69 Illness, unspecified: Secondary | ICD-10-CM | POA: Diagnosis not present

## 2020-12-04 DIAGNOSIS — G4730 Sleep apnea, unspecified: Secondary | ICD-10-CM | POA: Diagnosis not present

## 2020-12-04 DIAGNOSIS — R7303 Prediabetes: Secondary | ICD-10-CM | POA: Diagnosis not present

## 2020-12-04 DIAGNOSIS — Z4789 Encounter for other orthopedic aftercare: Secondary | ICD-10-CM | POA: Diagnosis not present

## 2020-12-04 DIAGNOSIS — K219 Gastro-esophageal reflux disease without esophagitis: Secondary | ICD-10-CM | POA: Diagnosis not present

## 2020-12-04 DIAGNOSIS — E782 Mixed hyperlipidemia: Secondary | ICD-10-CM | POA: Diagnosis not present

## 2020-12-04 DIAGNOSIS — G43909 Migraine, unspecified, not intractable, without status migrainosus: Secondary | ICD-10-CM | POA: Diagnosis not present

## 2020-12-04 DIAGNOSIS — Z981 Arthrodesis status: Secondary | ICD-10-CM | POA: Diagnosis not present

## 2020-12-10 DIAGNOSIS — N3090 Cystitis, unspecified without hematuria: Secondary | ICD-10-CM | POA: Diagnosis not present

## 2020-12-10 DIAGNOSIS — R509 Fever, unspecified: Secondary | ICD-10-CM | POA: Diagnosis not present

## 2020-12-10 DIAGNOSIS — G8918 Other acute postprocedural pain: Secondary | ICD-10-CM | POA: Diagnosis not present

## 2020-12-17 DIAGNOSIS — G43909 Migraine, unspecified, not intractable, without status migrainosus: Secondary | ICD-10-CM | POA: Diagnosis not present

## 2020-12-17 DIAGNOSIS — K219 Gastro-esophageal reflux disease without esophagitis: Secondary | ICD-10-CM | POA: Diagnosis not present

## 2020-12-17 DIAGNOSIS — R7303 Prediabetes: Secondary | ICD-10-CM | POA: Diagnosis not present

## 2020-12-17 DIAGNOSIS — G4730 Sleep apnea, unspecified: Secondary | ICD-10-CM | POA: Diagnosis not present

## 2020-12-17 DIAGNOSIS — Z981 Arthrodesis status: Secondary | ICD-10-CM | POA: Diagnosis not present

## 2020-12-17 DIAGNOSIS — R69 Illness, unspecified: Secondary | ICD-10-CM | POA: Diagnosis not present

## 2020-12-17 DIAGNOSIS — Z4789 Encounter for other orthopedic aftercare: Secondary | ICD-10-CM | POA: Diagnosis not present

## 2020-12-17 DIAGNOSIS — E782 Mixed hyperlipidemia: Secondary | ICD-10-CM | POA: Diagnosis not present

## 2020-12-17 DIAGNOSIS — M1990 Unspecified osteoarthritis, unspecified site: Secondary | ICD-10-CM | POA: Diagnosis not present

## 2020-12-19 DIAGNOSIS — K219 Gastro-esophageal reflux disease without esophagitis: Secondary | ICD-10-CM | POA: Diagnosis not present

## 2020-12-19 DIAGNOSIS — R7303 Prediabetes: Secondary | ICD-10-CM | POA: Diagnosis not present

## 2020-12-19 DIAGNOSIS — G4730 Sleep apnea, unspecified: Secondary | ICD-10-CM | POA: Diagnosis not present

## 2020-12-19 DIAGNOSIS — Z4789 Encounter for other orthopedic aftercare: Secondary | ICD-10-CM | POA: Diagnosis not present

## 2020-12-19 DIAGNOSIS — M1990 Unspecified osteoarthritis, unspecified site: Secondary | ICD-10-CM | POA: Diagnosis not present

## 2020-12-19 DIAGNOSIS — G43909 Migraine, unspecified, not intractable, without status migrainosus: Secondary | ICD-10-CM | POA: Diagnosis not present

## 2020-12-19 DIAGNOSIS — Z981 Arthrodesis status: Secondary | ICD-10-CM | POA: Diagnosis not present

## 2020-12-19 DIAGNOSIS — E782 Mixed hyperlipidemia: Secondary | ICD-10-CM | POA: Diagnosis not present

## 2020-12-19 DIAGNOSIS — R69 Illness, unspecified: Secondary | ICD-10-CM | POA: Diagnosis not present

## 2020-12-24 DIAGNOSIS — G43909 Migraine, unspecified, not intractable, without status migrainosus: Secondary | ICD-10-CM | POA: Diagnosis not present

## 2020-12-24 DIAGNOSIS — G4730 Sleep apnea, unspecified: Secondary | ICD-10-CM | POA: Diagnosis not present

## 2020-12-24 DIAGNOSIS — K219 Gastro-esophageal reflux disease without esophagitis: Secondary | ICD-10-CM | POA: Diagnosis not present

## 2020-12-24 DIAGNOSIS — Z4789 Encounter for other orthopedic aftercare: Secondary | ICD-10-CM | POA: Diagnosis not present

## 2020-12-24 DIAGNOSIS — M1990 Unspecified osteoarthritis, unspecified site: Secondary | ICD-10-CM | POA: Diagnosis not present

## 2020-12-24 DIAGNOSIS — R7303 Prediabetes: Secondary | ICD-10-CM | POA: Diagnosis not present

## 2020-12-24 DIAGNOSIS — E782 Mixed hyperlipidemia: Secondary | ICD-10-CM | POA: Diagnosis not present

## 2020-12-24 DIAGNOSIS — R69 Illness, unspecified: Secondary | ICD-10-CM | POA: Diagnosis not present

## 2020-12-24 DIAGNOSIS — Z981 Arthrodesis status: Secondary | ICD-10-CM | POA: Diagnosis not present

## 2020-12-25 DIAGNOSIS — K219 Gastro-esophageal reflux disease without esophagitis: Secondary | ICD-10-CM | POA: Diagnosis not present

## 2020-12-25 DIAGNOSIS — R69 Illness, unspecified: Secondary | ICD-10-CM | POA: Diagnosis not present

## 2020-12-25 DIAGNOSIS — Z4789 Encounter for other orthopedic aftercare: Secondary | ICD-10-CM | POA: Diagnosis not present

## 2020-12-25 DIAGNOSIS — R7303 Prediabetes: Secondary | ICD-10-CM | POA: Diagnosis not present

## 2020-12-25 DIAGNOSIS — E782 Mixed hyperlipidemia: Secondary | ICD-10-CM | POA: Diagnosis not present

## 2020-12-25 DIAGNOSIS — M1990 Unspecified osteoarthritis, unspecified site: Secondary | ICD-10-CM | POA: Diagnosis not present

## 2020-12-25 DIAGNOSIS — Z981 Arthrodesis status: Secondary | ICD-10-CM | POA: Diagnosis not present

## 2020-12-25 DIAGNOSIS — G4730 Sleep apnea, unspecified: Secondary | ICD-10-CM | POA: Diagnosis not present

## 2020-12-25 DIAGNOSIS — G43909 Migraine, unspecified, not intractable, without status migrainosus: Secondary | ICD-10-CM | POA: Diagnosis not present

## 2020-12-31 DIAGNOSIS — K219 Gastro-esophageal reflux disease without esophagitis: Secondary | ICD-10-CM | POA: Diagnosis not present

## 2020-12-31 DIAGNOSIS — Z4789 Encounter for other orthopedic aftercare: Secondary | ICD-10-CM | POA: Diagnosis not present

## 2020-12-31 DIAGNOSIS — Z981 Arthrodesis status: Secondary | ICD-10-CM | POA: Diagnosis not present

## 2020-12-31 DIAGNOSIS — R7303 Prediabetes: Secondary | ICD-10-CM | POA: Diagnosis not present

## 2020-12-31 DIAGNOSIS — R69 Illness, unspecified: Secondary | ICD-10-CM | POA: Diagnosis not present

## 2020-12-31 DIAGNOSIS — M1990 Unspecified osteoarthritis, unspecified site: Secondary | ICD-10-CM | POA: Diagnosis not present

## 2020-12-31 DIAGNOSIS — G4730 Sleep apnea, unspecified: Secondary | ICD-10-CM | POA: Diagnosis not present

## 2020-12-31 DIAGNOSIS — G43909 Migraine, unspecified, not intractable, without status migrainosus: Secondary | ICD-10-CM | POA: Diagnosis not present

## 2020-12-31 DIAGNOSIS — E782 Mixed hyperlipidemia: Secondary | ICD-10-CM | POA: Diagnosis not present

## 2021-01-09 DIAGNOSIS — G4730 Sleep apnea, unspecified: Secondary | ICD-10-CM | POA: Diagnosis not present

## 2021-01-09 DIAGNOSIS — R69 Illness, unspecified: Secondary | ICD-10-CM | POA: Diagnosis not present

## 2021-01-09 DIAGNOSIS — R7303 Prediabetes: Secondary | ICD-10-CM | POA: Diagnosis not present

## 2021-01-09 DIAGNOSIS — Z4789 Encounter for other orthopedic aftercare: Secondary | ICD-10-CM | POA: Diagnosis not present

## 2021-01-09 DIAGNOSIS — K219 Gastro-esophageal reflux disease without esophagitis: Secondary | ICD-10-CM | POA: Diagnosis not present

## 2021-01-09 DIAGNOSIS — M1990 Unspecified osteoarthritis, unspecified site: Secondary | ICD-10-CM | POA: Diagnosis not present

## 2021-01-09 DIAGNOSIS — Z981 Arthrodesis status: Secondary | ICD-10-CM | POA: Diagnosis not present

## 2021-01-09 DIAGNOSIS — E782 Mixed hyperlipidemia: Secondary | ICD-10-CM | POA: Diagnosis not present

## 2021-01-09 DIAGNOSIS — G43909 Migraine, unspecified, not intractable, without status migrainosus: Secondary | ICD-10-CM | POA: Diagnosis not present

## 2021-01-10 DIAGNOSIS — K219 Gastro-esophageal reflux disease without esophagitis: Secondary | ICD-10-CM | POA: Diagnosis not present

## 2021-01-10 DIAGNOSIS — G43909 Migraine, unspecified, not intractable, without status migrainosus: Secondary | ICD-10-CM | POA: Diagnosis not present

## 2021-01-10 DIAGNOSIS — R7303 Prediabetes: Secondary | ICD-10-CM | POA: Diagnosis not present

## 2021-01-10 DIAGNOSIS — Z4789 Encounter for other orthopedic aftercare: Secondary | ICD-10-CM | POA: Diagnosis not present

## 2021-01-10 DIAGNOSIS — G4730 Sleep apnea, unspecified: Secondary | ICD-10-CM | POA: Diagnosis not present

## 2021-01-10 DIAGNOSIS — M1990 Unspecified osteoarthritis, unspecified site: Secondary | ICD-10-CM | POA: Diagnosis not present

## 2021-01-10 DIAGNOSIS — Z981 Arthrodesis status: Secondary | ICD-10-CM | POA: Diagnosis not present

## 2021-01-10 DIAGNOSIS — E782 Mixed hyperlipidemia: Secondary | ICD-10-CM | POA: Diagnosis not present

## 2021-01-10 DIAGNOSIS — R69 Illness, unspecified: Secondary | ICD-10-CM | POA: Diagnosis not present

## 2021-01-14 DIAGNOSIS — M47816 Spondylosis without myelopathy or radiculopathy, lumbar region: Secondary | ICD-10-CM | POA: Diagnosis not present

## 2021-01-14 DIAGNOSIS — M4316 Spondylolisthesis, lumbar region: Secondary | ICD-10-CM | POA: Diagnosis not present

## 2021-01-14 DIAGNOSIS — M4326 Fusion of spine, lumbar region: Secondary | ICD-10-CM | POA: Diagnosis not present

## 2021-01-14 DIAGNOSIS — Z981 Arthrodesis status: Secondary | ICD-10-CM | POA: Diagnosis not present

## 2021-01-16 DIAGNOSIS — G4730 Sleep apnea, unspecified: Secondary | ICD-10-CM | POA: Diagnosis not present

## 2021-01-16 DIAGNOSIS — R7303 Prediabetes: Secondary | ICD-10-CM | POA: Diagnosis not present

## 2021-01-16 DIAGNOSIS — Z4789 Encounter for other orthopedic aftercare: Secondary | ICD-10-CM | POA: Diagnosis not present

## 2021-01-16 DIAGNOSIS — M1990 Unspecified osteoarthritis, unspecified site: Secondary | ICD-10-CM | POA: Diagnosis not present

## 2021-01-16 DIAGNOSIS — R69 Illness, unspecified: Secondary | ICD-10-CM | POA: Diagnosis not present

## 2021-01-16 DIAGNOSIS — G43909 Migraine, unspecified, not intractable, without status migrainosus: Secondary | ICD-10-CM | POA: Diagnosis not present

## 2021-01-16 DIAGNOSIS — K219 Gastro-esophageal reflux disease without esophagitis: Secondary | ICD-10-CM | POA: Diagnosis not present

## 2021-01-16 DIAGNOSIS — Z981 Arthrodesis status: Secondary | ICD-10-CM | POA: Diagnosis not present

## 2021-01-16 DIAGNOSIS — E782 Mixed hyperlipidemia: Secondary | ICD-10-CM | POA: Diagnosis not present

## 2021-01-21 DIAGNOSIS — Z4789 Encounter for other orthopedic aftercare: Secondary | ICD-10-CM | POA: Diagnosis not present

## 2021-01-21 DIAGNOSIS — Z981 Arthrodesis status: Secondary | ICD-10-CM | POA: Diagnosis not present

## 2021-01-21 DIAGNOSIS — R7303 Prediabetes: Secondary | ICD-10-CM | POA: Diagnosis not present

## 2021-01-21 DIAGNOSIS — K219 Gastro-esophageal reflux disease without esophagitis: Secondary | ICD-10-CM | POA: Diagnosis not present

## 2021-01-21 DIAGNOSIS — E782 Mixed hyperlipidemia: Secondary | ICD-10-CM | POA: Diagnosis not present

## 2021-01-21 DIAGNOSIS — R69 Illness, unspecified: Secondary | ICD-10-CM | POA: Diagnosis not present

## 2021-01-21 DIAGNOSIS — G4730 Sleep apnea, unspecified: Secondary | ICD-10-CM | POA: Diagnosis not present

## 2021-01-21 DIAGNOSIS — M1990 Unspecified osteoarthritis, unspecified site: Secondary | ICD-10-CM | POA: Diagnosis not present

## 2021-01-21 DIAGNOSIS — G43909 Migraine, unspecified, not intractable, without status migrainosus: Secondary | ICD-10-CM | POA: Diagnosis not present

## 2021-02-14 DIAGNOSIS — Z79891 Long term (current) use of opiate analgesic: Secondary | ICD-10-CM | POA: Diagnosis not present

## 2021-02-14 DIAGNOSIS — M5416 Radiculopathy, lumbar region: Secondary | ICD-10-CM | POA: Diagnosis not present

## 2021-02-14 DIAGNOSIS — G894 Chronic pain syndrome: Secondary | ICD-10-CM | POA: Diagnosis not present

## 2021-02-14 DIAGNOSIS — R69 Illness, unspecified: Secondary | ICD-10-CM | POA: Diagnosis not present

## 2021-02-14 DIAGNOSIS — M47816 Spondylosis without myelopathy or radiculopathy, lumbar region: Secondary | ICD-10-CM | POA: Diagnosis not present

## 2021-03-04 DIAGNOSIS — Z981 Arthrodesis status: Secondary | ICD-10-CM | POA: Diagnosis not present

## 2021-03-04 DIAGNOSIS — M4186 Other forms of scoliosis, lumbar region: Secondary | ICD-10-CM | POA: Diagnosis not present

## 2021-03-04 DIAGNOSIS — M4326 Fusion of spine, lumbar region: Secondary | ICD-10-CM | POA: Diagnosis not present

## 2021-05-03 DIAGNOSIS — F331 Major depressive disorder, recurrent, moderate: Secondary | ICD-10-CM | POA: Diagnosis not present

## 2021-05-03 DIAGNOSIS — F411 Generalized anxiety disorder: Secondary | ICD-10-CM | POA: Diagnosis not present

## 2021-05-03 DIAGNOSIS — R69 Illness, unspecified: Secondary | ICD-10-CM | POA: Diagnosis not present

## 2021-05-05 ENCOUNTER — Ambulatory Visit: Payer: Medicare HMO | Admitting: Gastroenterology

## 2021-05-05 ENCOUNTER — Other Ambulatory Visit: Payer: Self-pay

## 2021-05-05 ENCOUNTER — Encounter: Payer: Self-pay | Admitting: Gastroenterology

## 2021-05-05 VITALS — BP 126/78 | HR 82 | Temp 98.3°F | Ht 63.0 in | Wt 200.2 lb

## 2021-05-05 DIAGNOSIS — K219 Gastro-esophageal reflux disease without esophagitis: Secondary | ICD-10-CM

## 2021-05-05 DIAGNOSIS — Z800 Family history of malignant neoplasm of digestive organs: Secondary | ICD-10-CM

## 2021-05-05 DIAGNOSIS — R21 Rash and other nonspecific skin eruption: Secondary | ICD-10-CM | POA: Insufficient documentation

## 2021-05-05 DIAGNOSIS — Z86010 Personal history of colonic polyps: Secondary | ICD-10-CM

## 2021-05-05 NOTE — Addendum Note (Signed)
Addended by: Wayna Chalet on: 05/05/2021 03:51 PM   Modules accepted: Orders

## 2021-05-05 NOTE — Addendum Note (Signed)
Addended by: Wayna Chalet on: 05/05/2021 02:22 PM   Modules accepted: Orders

## 2021-05-05 NOTE — Progress Notes (Signed)
Jonathon Bellows MD, MRCP(U.K) [address]  [place]  [place], [place] [address]  Main: [phone]  Fax: [phone]   Gastroenterology Consultation  Referring Provider:     [name] * Primary Care Physician:  Olin Hauser, DO Primary Gastroenterologist:  Dr. Jonathon Bellows  Reason for Consultation:     Genetic testing        HPI:   Robin Ochoa is a 66 y.o. y/o female referred for consultation & management  by Dr. Parks Ranger, Devonne Doughty, DO.    I performed a colonoscopy for colon cancer screening 11/14/2020: Colonoscopy: 2 adenomas resected in the ascending colon 2 polyps in the transverse colon diverticulosis in the sigmoid colon.  Previously had multiple polyps taken out in June 2022 and 8 polyps were taken out at that point of time.Cumulatively she has had over 10 adenomatous  In total she has had at least 14 adenomas.  We discussed briefly about genetic testing on the [place] of her last procedure.  She is here to discuss the same.  She had a father who had stomach cancer as well as a grandmother with Stomach cancer.  She recollects both of them were smokers.  She has still has some heartburn but does not have any abdominal pain.  She would like to get screened for gastric cancer as well as undergo testing for her genetics in view of multiple polyps.  Past Medical History:  Diagnosis Date   Migraine    Neck pain, chronic    Spinal stenosis     Past Surgical History:  Procedure Laterality Date   BACK SURGERY     COLONOSCOPY WITH PROPOFOL N/A 10/07/2020   Procedure: COLONOSCOPY WITH PROPOFOL;  Surgeon: Jonathon Bellows, MD;  Location: [facility] ENDOSCOPY;  Service: Gastroenterology;  Laterality: N/A;   COLONOSCOPY WITH PROPOFOL N/A 11/14/2020   Procedure: COLONOSCOPY WITH PROPOFOL;  Surgeon: Jonathon Bellows, MD;  Location: [facility] ENDOSCOPY;  Service: Gastroenterology;  Laterality: N/A;   VAGINAL HYSTERECTOMY  66 years old   Tumors-     Prior to Admission  medications   Medication Sig Start Date End Date Taking? Authorizing Provider  aspirin EC 81 MG tablet Take 81 mg by mouth.    [provider]  azithromycin (ZITHROMAX Z-PAK) 250 MG tablet Take 2 tabs (500mg  total) on [place] 1. Take 1 tab (250mg ) daily for next 4 days. 03/28/18   Karamalegos, Devonne Doughty, DO  benzonatate (TESSALON) 100 MG capsule Take 1 capsule (100 mg total) by mouth 3 (three) times daily as needed for cough. 03/28/18   Karamalegos, Devonne Doughty, DO  buPROPion (WELLBUTRIN XL) 150 MG 24 hr tablet Take 1 tablet by mouth daily as needed. Patient not taking: Reported on 11/14/2020 08/05/20   [provider]  buPROPion (WELLBUTRIN) 100 MG tablet Take by mouth.    [provider]  cyclobenzaprine (FLEXERIL) 10 MG tablet Take 1 by mouth every 8 hours as needed for muscle spasm Patient not taking: Reported on 11/14/2020 12/07/17   [provider]  hydrochlorothiazide (HYDRODIURIL) 25 MG tablet Take 1 tablet (25 mg total) by mouth daily as needed. 06/22/17   Minna Merritts, MD  HYDROcodone-acetaminophen (NORCO/VICODIN) 5-325 MG tablet Take by mouth. 03/15/18   [provider]  pregabalin (LYRICA) 100 MG capsule Take 100 mg by mouth 2 (two) times daily. 04/10/21   [provider]  sertraline (ZOLOFT) 100 MG tablet Take by mouth. 04/22/21   [provider]  tiZANidine (ZANAFLEX) 4 MG  tablet Take 4 mg by mouth every 6 (six) hours as needed for muscle spasms.    [provider]    Family History  Problem Relation Age of Onset   Heart failure Mother    Cancer Father      Social History   Tobacco Use   Smoking status: Never   Smokeless tobacco: Never  Vaping Use   Vaping Use: Never used  Substance Use Topics   Alcohol use: Yes    Alcohol/week: 1.0 standard drink    Types: 1 Cans of beer per week   Drug use: No    Allergies as of 05/05/2021 - Review Complete 05/05/2021  Allergen Reaction Noted   Ibuprofen Other  (See Comments) 10/17/2014   Penicillins Rash 10/17/2014    Review of Systems:    All systems reviewed and negative except where noted in HPI.   Physical Exam:  BP 126/78    Pulse 82    Temp 98.3 F (36.8 C) (Oral)    Ht 5\' 3"  (1.6 m)    Wt 200 lb 3.2 oz (90.8 kg)    BMI 35.46 kg/m  No LMP recorded. Patient has had a hysterectomy. Psych:  Alert and cooperative. Normal mood and affect. General:   Alert,  Well-developed, well-nourished, pleasant and cooperative in NAD Head:  Normocephalic and atraumatic. Eyes:  Sclera clear, no icterus.   Conjunctiva pink. Ears:  Normal auditory acuity. Lungs:  Respirations even and unlabored.  Clear throughout to auscultation.   No wheezes, crackles, or rhonchi. No acute distress. Heart:  Regular rate and rhythm; no murmurs, clicks, rubs, or gallops. Abdomen:  Normal bowel sounds.  No bruits.  Soft, non-tender and non-distended without masses, hepatosplenomegaly or hernias noted.  No guarding or rebound tenderness.    Neurologic:  Alert and oriented x3;  grossly normal neurologically. Psych:  Alert and cooperative. Normal mood and affect.  Imaging Studies: No results found.  Assessment and Plan:   Robin Ochoa is a 66 y.o. y/o female is here to see me as she has had over 14 adenomas resected over her last screening colonoscopy procedures.  Explained to her that anytime over 10 adenomas resected would meet criteria for genetic testing to determine if she has any syndrome such as attenuated FAP.  In addition she mentions today that she has a family history of stomach cancer in her father and grandmother.  She has some history of heartburn.  Plan 1.  Refer to cancer center for genetic testing 2.  H. pylori breath test 3.  EGD to evaluate for heartburn [mild and occasional] as well as for history of stomach cancer in the family.  Lesser changes for acid reflux patient information provided  I have discussed alternative options, risks & benefits,  which  include, but are not limited to, bleeding, infection, perforation,respiratory complication & drug reaction.  The patient agrees with this plan & written consent will be obtained.    Follow up in as needed  Dr Jonathon Bellows MD,MRCP(U.K)

## 2021-05-05 NOTE — Patient Instructions (Signed)

## 2021-05-07 LAB — H. PYLORI BREATH TEST: H pylori Breath Test: NEGATIVE

## 2021-05-13 ENCOUNTER — Inpatient Hospital Stay: Payer: Medicare HMO | Attending: Oncology | Admitting: Licensed Clinical Social Worker

## 2021-05-13 ENCOUNTER — Inpatient Hospital Stay: Payer: Medicare HMO

## 2021-05-13 DIAGNOSIS — R69 Illness, unspecified: Secondary | ICD-10-CM | POA: Diagnosis not present

## 2021-05-13 DIAGNOSIS — M47816 Spondylosis without myelopathy or radiculopathy, lumbar region: Secondary | ICD-10-CM | POA: Diagnosis not present

## 2021-05-13 DIAGNOSIS — M5416 Radiculopathy, lumbar region: Secondary | ICD-10-CM | POA: Diagnosis not present

## 2021-05-13 DIAGNOSIS — F1190 Opioid use, unspecified, uncomplicated: Secondary | ICD-10-CM | POA: Diagnosis not present

## 2021-05-13 DIAGNOSIS — G894 Chronic pain syndrome: Secondary | ICD-10-CM | POA: Diagnosis not present

## 2021-05-16 ENCOUNTER — Ambulatory Visit
Admission: RE | Admit: 2021-05-16 | Discharge: 2021-05-16 | Disposition: A | Discharge status: Home / Self Care | Payer: Medicare HMO | Attending: Gastroenterology | Admitting: Gastroenterology

## 2021-05-16 ENCOUNTER — Encounter: Payer: Self-pay | Admitting: Gastroenterology

## 2021-05-16 ENCOUNTER — Other Ambulatory Visit: Payer: Self-pay

## 2021-05-16 ENCOUNTER — Ambulatory Visit: Payer: Medicare HMO | Admitting: Anesthesiology

## 2021-05-16 ENCOUNTER — Encounter
Admission: RE | Disposition: A | Discharge status: Home / Self Care | Payer: Self-pay | Source: Home / Self Care | Attending: Gastroenterology

## 2021-05-16 DIAGNOSIS — D131 Benign neoplasm of stomach: Secondary | ICD-10-CM | POA: Diagnosis not present

## 2021-05-16 DIAGNOSIS — K317 Polyp of stomach and duodenum: Secondary | ICD-10-CM | POA: Diagnosis not present

## 2021-05-16 DIAGNOSIS — K219 Gastro-esophageal reflux disease without esophagitis: Secondary | ICD-10-CM

## 2021-05-16 DIAGNOSIS — R12 Heartburn: Secondary | ICD-10-CM | POA: Diagnosis not present

## 2021-05-16 HISTORY — PX: ESOPHAGOGASTRODUODENOSCOPY (EGD) WITH PROPOFOL: SHX5813

## 2021-05-16 SURGERY — ESOPHAGOGASTRODUODENOSCOPY (EGD) WITH PROPOFOL
Anesthesia: General

## 2021-05-16 MED ORDER — PROPOFOL 10 MG/ML IV BOLUS
INTRAVENOUS | Status: DC | PRN
Start: 2021-05-16 — End: 2021-05-16
  Administered 2021-05-16: 80 mg via INTRAVENOUS
  Administered 2021-05-16: 30 mg via INTRAVENOUS
  Administered 2021-05-16: 20 mg via INTRAVENOUS

## 2021-05-16 MED ORDER — PROPOFOL 500 MG/50ML IV EMUL
INTRAVENOUS | Status: AC
  Filled 2021-05-16: qty 50

## 2021-05-16 MED ORDER — LIDOCAINE HCL (CARDIAC) PF 100 MG/5ML IV SOSY
PREFILLED_SYRINGE | INTRAVENOUS | Status: DC | PRN
  Administered 2021-05-16: 50 mg via INTRAVENOUS

## 2021-05-16 MED ORDER — LIDOCAINE HCL (PF) 1 % IJ SOLN
INTRAMUSCULAR | Status: AC
  Administered 2021-05-16: 0.5 mL
  Filled 2021-05-16: qty 2

## 2021-05-16 MED ORDER — DEXMEDETOMIDINE HCL IN NACL 200 MCG/50ML IV SOLN
INTRAVENOUS | Status: AC
  Filled 2021-05-16: qty 50

## 2021-05-16 MED ORDER — SODIUM CHLORIDE 0.9 % IV SOLN: INTRAVENOUS | Status: DC

## 2021-05-16 MED ORDER — PROPOFOL 10 MG/ML IV BOLUS
INTRAVENOUS | Status: AC
  Filled 2021-05-16: qty 20

## 2021-05-16 MED ORDER — DEXMEDETOMIDINE HCL IN NACL 200 MCG/50ML IV SOLN
INTRAVENOUS | Status: DC | PRN
  Administered 2021-05-16: 8 ug via INTRAVENOUS
  Administered 2021-05-16: 4 ug via INTRAVENOUS

## 2021-05-16 MED ORDER — LIDOCAINE HCL (PF) 2 % IJ SOLN
INTRAMUSCULAR | Status: AC
  Filled 2021-05-16: qty 5

## 2021-05-16 NOTE — Anesthesia Postprocedure Evaluation (Signed)
Anesthesia Post Note  Patient: Robin Ochoa  Procedure(s) Performed: ESOPHAGOGASTRODUODENOSCOPY (EGD) WITH PROPOFOL  Patient location during evaluation: Endoscopy Anesthesia Type: General Level of consciousness: awake and alert Pain management: pain level controlled Vital Signs Assessment: post-procedure vital signs reviewed and stable Respiratory status: spontaneous breathing, nonlabored ventilation, respiratory function stable and patient connected to nasal cannula oxygen Cardiovascular status: blood pressure returned to baseline and stable Postop Assessment: no apparent nausea or vomiting Anesthetic complications: no   No notable events documented.   Last Vitals:  Vitals:   05/16/21 0902 05/16/21 0912  BP: 112/65 112/76  Pulse: 65 65  Resp: 16 17  Temp: (!) 36.1 C   SpO2: 94% 94%    Last Pain:  Vitals:   05/16/21 0912  TempSrc:   PainSc: 0-No pain                 Precious Haws Broden Holt

## 2021-05-16 NOTE — Transfer of Care (Signed)
Immediate Anesthesia Transfer of Care Note  Patient: Robin Ochoa  Procedure(s) Performed: ESOPHAGOGASTRODUODENOSCOPY (EGD) WITH PROPOFOL  Patient Location: PACU and Endoscopy Unit  Anesthesia Type:General  Level of Consciousness: drowsy  Airway & Oxygen Therapy: Patient Spontanous Breathing  Post-op Assessment: Report given to RN and Post -op Vital signs reviewed and stable  Post vital signs: Reviewed and stable  Last Vitals:  Vitals Value Taken Time  BP 112/65 05/16/21 0902  Temp 36.1 C 05/16/21 0902  Pulse 65 05/16/21 0904  Resp 22 05/16/21 0904  SpO2 92 % 05/16/21 0904  Vitals shown include unvalidated device data.  Last Pain:  Vitals:   05/16/21 0902  TempSrc: Temporal  PainSc: Asleep         Complications: No notable events documented.

## 2021-05-16 NOTE — Anesthesia Preprocedure Evaluation (Signed)
Anesthesia Evaluation  Patient identified by MRN, date of birth, ID band Patient awake    Reviewed: Allergy & Precautions, NPO status , Patient's Chart, lab work & pertinent test results  History of Anesthesia Complications Negative for: history of anesthetic complications  Airway Mallampati: III  TM Distance: >3 FB Neck ROM: full    Dental  (+) Chipped   Pulmonary neg pulmonary ROS, neg shortness of breath,    Pulmonary exam normal        Cardiovascular Exercise Tolerance: Good (-) anginanegative cardio ROS Normal cardiovascular exam     Neuro/Psych  Headaches, PSYCHIATRIC DISORDERS  Neuromuscular disease    GI/Hepatic Neg liver ROS, GERD  Controlled,  Endo/Other  negative endocrine ROS  Renal/GU negative Renal ROS  negative genitourinary   Musculoskeletal  (+) Arthritis ,   Abdominal   Peds  Hematology negative hematology ROS (+)   Anesthesia Other Findings Past Medical History: No date: Migraine No date: Neck pain, chronic No date: Spinal stenosis  Past Surgical History: No date: BACK SURGERY 10/07/2020: COLONOSCOPY WITH PROPOFOL; N/A     Comment:  Procedure: COLONOSCOPY WITH PROPOFOL;  Surgeon: Jonathon Bellows, MD;  Location: [facility] ENDOSCOPY;  Service:               Gastroenterology;  Laterality: N/A; 11/14/2020: COLONOSCOPY WITH PROPOFOL; N/A     Comment:  Procedure: COLONOSCOPY WITH PROPOFOL;  Surgeon: Jonathon Bellows, MD;  Location: [facility] ENDOSCOPY;  Service:               Gastroenterology;  Laterality: N/A; 66 years old: VAGINAL HYSTERECTOMY     Comment:  Tumors-   BMI    Body Mass Index: 34.54 kg/m      Reproductive/Obstetrics negative OB ROS                             Anesthesia Physical Anesthesia Plan  ASA: 3  Anesthesia Plan: General   Post-op Pain Management:    Induction: Intravenous  PONV Risk Score and Plan: Propofol infusion and  TIVA  Airway Management Planned: Natural Airway and Nasal Cannula  Additional Equipment:   Intra-op Plan:   Post-operative Plan:   Informed Consent: I have reviewed the patients History and Physical, chart, labs and discussed the procedure including the risks, benefits and alternatives for the proposed anesthesia with the patient or authorized representative who has indicated his/her understanding and acceptance.     Dental Advisory Given  Plan Discussed with: Anesthesiologist, CRNA and Surgeon  Anesthesia Plan Comments: (Patient consented for risks of anesthesia including but not limited to:  - adverse reactions to medications - risk of airway placement if required - damage to eyes, teeth, lips or other oral mucosa - nerve damage due to positioning  - sore throat or hoarseness - Damage to heart, brain, nerves, lungs, other parts of body or loss of life  Patient voiced understanding.)        Anesthesia Quick Evaluation

## 2021-05-16 NOTE — H&P (Signed)
Robin Bellows, MD [address], [place], [place], [place], [address] [address], [place], [place], [place], [address] Phone: [phone]  Fax: [phone]  Primary Care Physician:  Olin Hauser, DO   Pre-Procedure History & Physical: HPI:  Robin Ochoa is a 66 y.o. female is here for an endoscopy    Past Medical History:  Diagnosis Date   Migraine    Neck pain, chronic    Spinal stenosis     Past Surgical History:  Procedure Laterality Date   BACK SURGERY     COLONOSCOPY WITH PROPOFOL N/A 10/07/2020   Procedure: COLONOSCOPY WITH PROPOFOL;  Surgeon: Robin Bellows, MD;  Location: [facility] ENDOSCOPY;  Service: Gastroenterology;  Laterality: N/A;   COLONOSCOPY WITH PROPOFOL N/A 11/14/2020   Procedure: COLONOSCOPY WITH PROPOFOL;  Surgeon: Robin Bellows, MD;  Location: [facility] ENDOSCOPY;  Service: Gastroenterology;  Laterality: N/A;   VAGINAL HYSTERECTOMY  66 years old   Tumors-     Prior to Admission medications   Medication Sig Start Date End Date Taking? Authorizing Provider  buPROPion (WELLBUTRIN XL) 150 MG 24 hr tablet Take 1 tablet by mouth daily as needed. 08/05/20  Yes [provider]  hydrochlorothiazide (HYDRODIURIL) 25 MG tablet Take 1 tablet (25 mg total) by mouth daily as needed. 06/22/17  Yes Minna Merritts, MD  HYDROcodone-acetaminophen (NORCO/VICODIN) 5-325 MG tablet Take by mouth. 03/15/18  Yes [provider]  pregabalin (LYRICA) 100 MG capsule Take 100 mg by mouth 2 (two) times daily. 04/10/21  Yes [provider]  sertraline (ZOLOFT) 100 MG tablet Take by mouth. 04/22/21  Yes [provider]  tiZANidine (ZANAFLEX) 4 MG tablet Take 4 mg by mouth every 6 (six) hours as needed for muscle spasms.   Yes [provider]    Allergies as of 05/05/2021 - Review Complete 05/05/2021  Allergen Reaction Noted   Ibuprofen Other (See Comments) 10/17/2014   Penicillins Rash 10/17/2014    Family History  Problem  Relation Age of Onset   Heart failure Mother    Cancer Father     Social History   Socioeconomic History   Marital status: Married    Spouse name: Not on file   Number of children: Not on file   Years of education: Not on file   Highest education level: Not on file  Occupational History   Not on file  Tobacco Use   Smoking status: Never   Smokeless tobacco: Never  Vaping Use   Vaping Use: Never used  Substance and Sexual Activity   Alcohol use: Not Currently    Comment: occ   Drug use: No   Sexual activity: Not on file  Other Topics Concern   Not on file  Social History Narrative   Not on file   Social Determinants of Health   Financial Resource Strain: Not on file  Food Insecurity: Not on file  Transportation Needs: Not on file  Physical Activity: Not on file  Stress: Not on file  Social Connections: Not on file  Intimate Partner Violence: Not on file    Review of Systems: See HPI, otherwise negative ROS  Physical Exam: BP 134/66    Pulse 79    Temp (!) 96.5 F (35.8 C) (Temporal)    Resp 18    Ht 5\' 3"  (1.6 m)    Wt 88.5 kg    SpO2 99%    BMI 34.54 kg/m  General:   Alert,  pleasant and cooperative  in NAD Head:  Normocephalic and atraumatic. Neck:  Supple; no masses or thyromegaly. Lungs:  Clear throughout to auscultation, normal respiratory effort.    Heart:  +S1, +S2, Regular rate and rhythm, No edema. Abdomen:  Soft, nontender and nondistended. Normal bowel sounds, without guarding, and without rebound.   Neurologic:  Alert and  oriented x4;  grossly normal neurologically.  Impression/Plan: [name] is here for an endoscopy  to be performed for  evaluation of heartburn    Risks, benefits, limitations, and alternatives regarding endoscopy have been reviewed with the patient.  Questions have been answered.  All parties agreeable.   Robin Bellows, MD  05/16/2021, 8:40 AM

## 2021-05-16 NOTE — Op Note (Signed)
[facility] Gastroenterology Patient Name: Robin Ochoa Procedure Date: 05/16/2021 8:47 AM MRN: [id number] Account #: [id number] Date of Birth: Jan 06, 1956 Admit Type: Outpatient Age: 66 Room: [facility] ENDO ROOM 4 Gender: Female Note Status: Finalized Instrument Name: Upper Endoscope [id number] Procedure:             Upper GI endoscopy Indications:           Heartburn Providers:             Jonathon Bellows MD, MD Referring MD:          Olin Hauser (Referring MD) Medicines:             Monitored Anesthesia Care Complications:         No immediate complications. Procedure:             Pre-Anesthesia Assessment:                        - Prior to the procedure, a History and Physical was                         performed, and patient medications, allergies and                         sensitivities were reviewed. The patient's tolerance                         of previous anesthesia was reviewed.                        - The risks and benefits of the procedure and the                         sedation options and risks were discussed with the                         patient. All questions were answered and informed                         consent was obtained.                        - ASA Grade Assessment: II - A patient with mild                         systemic disease.                        After obtaining informed consent, the endoscope was                         passed under direct vision. Throughout the procedure,                         the patient's blood pressure, pulse, and oxygen                         saturations were monitored continuously. The Endoscope                         was introduced through  the mouth, and advanced to the                         third part of duodenum. The upper GI endoscopy was                         accomplished with ease. The patient tolerated the                         procedure well. Findings:      The esophagus was  normal.      The examined duodenum was normal.      Normal mucosa was found in the entire examined stomach.      Two 4 to 5 mm sessile polyps with no bleeding and no stigmata of recent       bleeding were found on the greater curvature of the stomach. The polyp       was removed with a cold biopsy forceps. Resection and retrieval were       complete.      The cardia and gastric fundus were normal on retroflexion. Impression:            - Normal esophagus.                        - Normal examined duodenum.                        - Normal mucosa was found in the entire stomach.                        - Two gastric polyps. Resected and retrieved. Recommendation:        - Discharge patient to home (with escort).                        - Resume previous diet.                        - Continue present medications.                        - Await pathology results.                        - Return to my office as previously scheduled. Procedure Code(s):     --- Professional ---                        [phone], Esophagogastroduodenoscopy, flexible,                         transoral; with biopsy, single or multiple Diagnosis Code(s):     --- Professional ---                        K31.7, Polyp of stomach and duodenum                        R12, Heartburn CPT copyright 2019 American Medical Association. All rights reserved. The codes documented in this report are preliminary and upon coder review may  be revised to meet current compliance requirements. Jonathon Bellows, MD Bailey Mech  Vicente Males MD, MD 05/16/2021 9:02:42 AM This report has been signed electronically. Number of Addenda: 0 Note Initiated On: 05/16/2021 8:47 AM Estimated Blood Loss:  Estimated blood loss: none.      [facility]

## 2021-05-19 ENCOUNTER — Encounter: Payer: Self-pay | Admitting: Gastroenterology

## 2021-05-19 LAB — SURGICAL PATHOLOGY

## 2021-06-25 DIAGNOSIS — Z981 Arthrodesis status: Secondary | ICD-10-CM | POA: Diagnosis not present

## 2021-06-25 DIAGNOSIS — M48062 Spinal stenosis, lumbar region with neurogenic claudication: Secondary | ICD-10-CM | POA: Diagnosis not present

## 2021-06-25 DIAGNOSIS — M5136 Other intervertebral disc degeneration, lumbar region: Secondary | ICD-10-CM | POA: Diagnosis not present

## 2021-08-15 DIAGNOSIS — Z79891 Long term (current) use of opiate analgesic: Secondary | ICD-10-CM | POA: Diagnosis not present

## 2021-08-15 DIAGNOSIS — M47816 Spondylosis without myelopathy or radiculopathy, lumbar region: Secondary | ICD-10-CM | POA: Diagnosis not present

## 2021-08-15 DIAGNOSIS — R69 Illness, unspecified: Secondary | ICD-10-CM | POA: Diagnosis not present

## 2021-08-15 DIAGNOSIS — M5416 Radiculopathy, lumbar region: Secondary | ICD-10-CM | POA: Diagnosis not present

## 2021-08-15 DIAGNOSIS — G894 Chronic pain syndrome: Secondary | ICD-10-CM | POA: Diagnosis not present

## 2021-08-15 DIAGNOSIS — F1190 Opioid use, unspecified, uncomplicated: Secondary | ICD-10-CM | POA: Diagnosis not present

## 2021-09-29 ENCOUNTER — Telehealth: Payer: Self-pay | Admitting: Gastroenterology

## 2021-09-29 NOTE — Telephone Encounter (Signed)
Patient requesting medical advice for symptoms. Requesting a call back.

## 2021-09-30 NOTE — Telephone Encounter (Signed)
Called patient back and left her a detailed message letting her know that I also sent her a MyChart message and to please reply back or call to help her.

## 2021-10-09 DIAGNOSIS — Z6833 Body mass index (BMI) 33.0-33.9, adult: Secondary | ICD-10-CM | POA: Diagnosis not present

## 2021-10-09 DIAGNOSIS — Z0000 Encounter for general adult medical examination without abnormal findings: Secondary | ICD-10-CM | POA: Diagnosis not present

## 2021-10-15 DIAGNOSIS — M7989 Other specified soft tissue disorders: Secondary | ICD-10-CM | POA: Diagnosis not present

## 2021-10-15 DIAGNOSIS — K146 Glossodynia: Secondary | ICD-10-CM | POA: Diagnosis not present

## 2021-10-15 DIAGNOSIS — K2900 Acute gastritis without bleeding: Secondary | ICD-10-CM | POA: Diagnosis not present

## 2021-10-15 DIAGNOSIS — K591 Functional diarrhea: Secondary | ICD-10-CM | POA: Diagnosis not present

## 2021-10-15 DIAGNOSIS — Z6833 Body mass index (BMI) 33.0-33.9, adult: Secondary | ICD-10-CM | POA: Diagnosis not present

## 2021-10-16 ENCOUNTER — Telehealth: Payer: Self-pay

## 2021-10-16 NOTE — Telephone Encounter (Signed)
Patient called and left a voicemail letting us know that she has had severe acid reflux, sensitive tongue, abdominal pain and losing weight. Patient stated that she went to see her doctor yesterday and he prescribed her a mouth wash and Omeprazole to start with. However, he told patient to schedule an appointment with you. Therefore, I scheduled her for next week.

## 2021-10-23 DIAGNOSIS — R7989 Other specified abnormal findings of blood chemistry: Secondary | ICD-10-CM | POA: Diagnosis not present

## 2021-10-23 DIAGNOSIS — K635 Polyp of colon: Secondary | ICD-10-CM | POA: Diagnosis not present

## 2021-10-23 DIAGNOSIS — R748 Abnormal levels of other serum enzymes: Secondary | ICD-10-CM | POA: Diagnosis not present

## 2021-10-23 DIAGNOSIS — Z712 Person consulting for explanation of examination or test findings: Secondary | ICD-10-CM | POA: Diagnosis not present

## 2021-10-29 ENCOUNTER — Ambulatory Visit: Payer: Medicare HMO | Admitting: Gastroenterology

## 2021-10-29 ENCOUNTER — Encounter: Payer: Self-pay | Admitting: Gastroenterology

## 2021-10-29 VITALS — BP 128/73 | HR 77 | Ht 63.0 in | Wt 192.8 lb

## 2021-10-29 DIAGNOSIS — K219 Gastro-esophageal reflux disease without esophagitis: Secondary | ICD-10-CM | POA: Diagnosis not present

## 2021-10-29 DIAGNOSIS — K580 Irritable bowel syndrome with diarrhea: Secondary | ICD-10-CM

## 2021-10-29 DIAGNOSIS — Z86010 Personal history of colonic polyps: Secondary | ICD-10-CM | POA: Diagnosis not present

## 2021-10-29 MED ORDER — SUTAB 1479-225-188 MG PO TABS: 12.0000 | ORAL_TABLET | Freq: Once | ORAL | 0 refills | Status: AC

## 2021-10-29 NOTE — Progress Notes (Signed)
Jonathon Bellows MD, MRCP(U.K) [address]  [place]  [place], [place] [address]  Main: [phone]  Fax: [phone]   Primary Care Physician: Romualdo Bolk, FNP  Primary Gastroenterologist:  Dr. Jonathon Bellows   Chief Complaint  Patient presents with   Follow-up    GERD, Abdominal Pain    HPI: Robin Ochoa is a 66 y.o. female    Summary of history :  Initially seen on 05/05/2021 for genetic testing.  This was performed for multiple adenomas 11/14/2020: Colonoscopy: 2 adenomas resected in the ascending colon 2 polyps in the transverse colon diverticulosis in the sigmoid colon.  Previously had multiple polyps taken out in June 2022 and 8 polyps were taken out at that point of time.Cumulatively she has had over 10 adenomatous   In total she has had at least 14 adenomas.  We discussed briefly about genetic testing on the [place] of her last procedure.  She is here to discuss the same.  She had a father who had stomach cancer as well as a grandmother with Stomach cancer.  She recollects both of them were smokers.  She also mentioned about reflux at her last visit   Interval history 05/05/2021-10/29/2021 05/05/2021: H. pylori breath test negative 05/16/2021: EGD: Sessile polyp was seen in the greater curvature the stomach removed with cold biopsy forceps otherwise examination is normal the polyps were fundic gland polyps  10/15/2021 hemoglobin 15 g   Call our office with severe acid reflux and hence wanted to see [place].  She says that about 2 months back she had an episode of severe nausea vomiting that lasted a few days and resolved at that point of time had severe heartburn change in sensation of her heart tongue postprandial bowel movements more often but not watery.  She drinks a lot of sweet tea a Caraher with about a cup and a half of sugar in it.  She is on hydrocodone for back pain she had back surgery recently.  Denies any NSAID use she is on Prilosec 40 mg twice a Bloodsaw.    Current  Outpatient Medications  Medication Sig Dispense Refill   buPROPion (WELLBUTRIN XL) 150 MG 24 hr tablet Take 1 tablet by mouth daily as needed.     Diphenhyd-Hydrocort-Nystatin (FIRST-DUKES MOUTHWASH MT) Take by mouth.     hydrochlorothiazide (HYDRODIURIL) 25 MG tablet Take 1 tablet (25 mg total) by mouth daily as needed. 30 tablet 6   HYDROcodone-acetaminophen (NORCO/VICODIN) 5-325 MG tablet Take by mouth.     omeprazole (PRILOSEC) 20 MG capsule Take 20 mg by mouth 2 (two) times daily.     pregabalin (LYRICA) 100 MG capsule Take 100 mg by mouth 2 (two) times daily.     sertraline (ZOLOFT) 100 MG tablet Take by mouth.     tiZANidine (ZANAFLEX) 4 MG tablet Take 4 mg by mouth every 6 (six) hours as needed for muscle spasms.     aspirin EC 81 MG tablet Take 1 tablet by mouth daily. (Patient not taking: Reported on 10/29/2021)     No current facility-administered medications for this visit.    Allergies as of 10/29/2021 - Review Complete 10/29/2021  Allergen Reaction Noted   Ibuprofen Other (See Comments) 10/17/2014   Penicillins Rash 10/17/2014    ROS:  General: Negative for anorexia, weight loss, fever, chills, fatigue, weakness. ENT: Negative for hoarseness, difficulty swallowing , nasal congestion. CV: Negative for chest pain, angina, palpitations, dyspnea on exertion, peripheral edema.  Respiratory: Negative  for dyspnea at rest, dyspnea on exertion, cough, sputum, wheezing.  GI: See history of present illness. GU:  Negative for dysuria, hematuria, urinary incontinence, urinary frequency, nocturnal urination.  Endo: Negative for unusual weight change.    Physical Examination:   BP 128/73 (BP Location: Right Arm, Patient Position: Sitting, Cuff Size: Normal)   Pulse 77   Ht '5\' 3"'$  (1.6 m)   Wt 192 lb 12.8 oz (87.5 kg)   BMI 34.15 kg/m   General: Well-nourished, well-developed in no acute distress.  Eyes: No icterus. Conjunctivae pink. Mouth: Oropharyngeal mucosa moist and pink ,  no lesions erythema or exudate. Neuro: Alert and oriented x 3.  Grossly intact. Skin: Warm and dry, no jaundice.   Psych: Alert and cooperative, normal mood and affect.   Imaging Studies: No results found.  Assessment and Plan:   Robin Ochoa is a 66 y.o. y/o female with a history of over 14 tubular adenomas taken out from her colon.  Due for surveillance colonoscopy.  History of reflux.  Recent worsening of symptoms of acid reflux and other GI discomfort, postprandial bowel movements although not watery.  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  Plan 1.  Surveillance colonoscopy due to personal history of over 14 tubular adenomas.  She is going to have genetic testing soon.  She has a date available. 2.  Stop consuming large quantity of sweet tea, gradually wean off hydrocodone, continue PPI.  If symptoms continue to persist at that time we will consider evaluation of the gallbladder, IBS diarrhea     I have discussed alternative options, risks & benefits,  which include, but are not limited to, bleeding, infection, perforation,respiratory complication & drug reaction.  The patient agrees with this plan & written consent will be obtained.    Dr Jonathon Bellows  MD,MRCP [facility]) Follow up in as needed

## 2021-10-29 NOTE — Addendum Note (Signed)
Addended by: Eliseo Squires on: 10/29/2021 02:16 PM   Modules accepted: Orders

## 2021-11-04 ENCOUNTER — Encounter: Payer: Self-pay | Admitting: Licensed Clinical Social Worker

## 2021-11-04 ENCOUNTER — Telehealth: Payer: Self-pay

## 2021-11-04 ENCOUNTER — Inpatient Hospital Stay: Payer: Medicare HMO | Attending: Oncology | Admitting: Licensed Clinical Social Worker

## 2021-11-04 ENCOUNTER — Inpatient Hospital Stay: Payer: Medicare HMO

## 2021-11-04 DIAGNOSIS — Z800 Family history of malignant neoplasm of digestive organs: Secondary | ICD-10-CM

## 2021-11-04 DIAGNOSIS — Z8041 Family history of malignant neoplasm of ovary: Secondary | ICD-10-CM | POA: Diagnosis not present

## 2021-11-04 DIAGNOSIS — Z86010 Personal history of colonic polyps: Secondary | ICD-10-CM | POA: Diagnosis not present

## 2021-11-04 DIAGNOSIS — Z803 Family history of malignant neoplasm of breast: Secondary | ICD-10-CM | POA: Diagnosis not present

## 2021-11-04 NOTE — Telephone Encounter (Signed)
Mailed more coupons for sutab called left voicemail

## 2021-11-04 NOTE — Progress Notes (Signed)
REFERRING PROVIDER: Jonathon Bellows, MD [address] [place],  [place] [address]  PRIMARY PROVIDER:  Romualdo Bolk, FNP  PRIMARY REASON FOR VISIT:  1. Personal history of colonic polyps   2. Family history of ovarian cancer   3. Family history of colon cancer   4. Family history of stomach cancer      HISTORY OF PRESENT ILLNESS:   Ms. 22, a 66 y.o. female, was seen for a [place] cancer genetics consultation at the request of Dr. Vicente Males due to a personal history of colon polyps and family history of cancer.  Robin Ochoa presents to clinic today to discuss the possibility of a hereditary predisposition to cancer, genetic testing, and to further clarify her future cancer risks, as well as potential cancer risks for family members.   Robin Ochoa is a 66 y.o. female with no personal history of cancer.    CANCER HISTORY:  Oncology History   No history exists.     RISK FACTORS:  Menarche was at age 57.  First live birth at age 26.  OCP use for approximately 2 years.  Ovaries intact: no. - has 1 ovary Hysterectomy: yes.  Menopausal status: postmenopausal.  HRT use: yes Colonoscopy: yes;  has had 3 or 4 total; first at [facility]. In 2022, 12 polyps, + fundic gland polyps . Mammogram within the last year: yes. Number of breast biopsies: 0. Up to date with pelvic exams: yes.  Past Medical History:  Diagnosis Date   Migraine    Neck pain, chronic    Spinal stenosis     Past Surgical History:  Procedure Laterality Date   BACK SURGERY     COLONOSCOPY WITH PROPOFOL N/A 10/07/2020   Procedure: COLONOSCOPY WITH PROPOFOL;  Surgeon: Jonathon Bellows, MD;  Location: [facility] ENDOSCOPY;  Service: Gastroenterology;  Laterality: N/A;   COLONOSCOPY WITH PROPOFOL N/A 11/14/2020   Procedure: COLONOSCOPY WITH PROPOFOL;  Surgeon: Jonathon Bellows, MD;  Location: [facility] ENDOSCOPY;  Service: Gastroenterology;  Laterality: N/A;   ESOPHAGOGASTRODUODENOSCOPY (EGD) WITH PROPOFOL N/A 05/16/2021   Procedure:  ESOPHAGOGASTRODUODENOSCOPY (EGD) WITH PROPOFOL;  Surgeon: Jonathon Bellows, MD;  Location: [facility] ENDOSCOPY;  Service: Gastroenterology;  Laterality: N/A;   VAGINAL HYSTERECTOMY  66 years old   Tumors-     FAMILY HISTORY:  We obtained a detailed, 4-generation family history.  Significant diagnoses are listed below: Family History  Problem Relation Age of Onset   Heart failure Mother    Stomach cancer Father 85   Ovarian cancer Maternal Aunt    Ovarian cancer Maternal Aunt    Cancer Maternal Aunt        unk type; female origin   Lung cancer Paternal Uncle    Stomach cancer Paternal Grandmother        d. 82   Stomach cancer Other    Colon cancer Cousin        2 mat cousins; both d. 56s   Breast cancer Cousin        maternal cousin dx late 65s   Robin Ochoa has 1 son, 54 and 1 daughter, 47.   Robin Ochoa mother passed at 31. Patient had 2 maternal aunts that had ovarian cancer. Another aunt had cancer, unknown primary, but thought to be GYN origin. Patient also has 2 maternal cousins who passed of colon cancer in their late 19s and a cousin who had breast cancer in her late 44s. Maternal grandparents passed over age 71.  Robin Ochoa father passed of stomach  cancer at 47. Patient had a paternal uncle who passed of lung cancer in his 12s and a cousin who also passed of lung cancer. Paternal grandmother passed at 38 of stomach cancer and her sister also passed of stomach cancer.   Robin Ochoa is unaware of previous family history of genetic testing for hereditary cancer risks. There is no reported Ashkenazi Jewish ancestry. There is no known consanguinity.    GENETIC COUNSELING ASSESSMENT: Robin Ochoa is a 66 y.o. female with a personal history of colon polyps and family history of cancer which is somewhat suggestive of a hereditary cancer syndrome and predisposition to cancer. We, therefore, discussed and recommended the following at today's visit.   DISCUSSION: We discussed that polyps in general are  common, however, most people have fewer than 5 lifetime polyps.  When an individual has 10 or more polyps we become concerned about an underlying polyposis syndrome.  The most common hereditary polyposis syndromes are caused by problems in the APC and MUTYH genes. We also discussed that approximately 10% of cancer is hereditary. Cancers and risks are gene specific. We discussed that testing is beneficial for several reasons including knowing about cancer risks, identifying potential screening and risk-reduction options that may be appropriate, and to understand if other family members could be at risk for cancer and allow them to undergo genetic testing.   We reviewed the characteristics, features and inheritance patterns of hereditary cancer syndromes. We also discussed genetic testing, including the appropriate family members to test, the process of testing, insurance coverage and turn-around-time for results. We discussed the implications of a negative, positive and/or variant of uncertain significant result. We recommended Robin Ochoa pursue genetic testing for the [facility] Multi-Cancer+RNA gene panel.   Based on Robin Ochoa's family history of cancer, she meets medical criteria for genetic testing. Despite that she meets criteria, she may still have an out of pocket cost. We discussed that if her out of pocket cost for testing is over $100, the laboratory will call and confirm whether she wants to proceed with testing.  If the out of pocket cost of testing is less than $100 she will be billed by the genetic testing laboratory.   PLAN: After considering the risks, benefits, and limitations, Robin Ochoa provided informed consent to pursue genetic testing and the blood sample was sent to [facility] for analysis of the Multi-Cancer+RNA panel. Results should be available within approximately 2-3 weeks' time, at which point they will be disclosed by telephone to Robin Ochoa, as will any additional recommendations  warranted by these results. Robin Ochoa will receive a summary of her genetic counseling visit and a copy of her results once available. This information will also be available in Epic.   Robin Ochoa questions were answered to her satisfaction today. Our contact information was provided should additional questions or concerns arise. Thank you for the referral and allowing [place] to share in the care of your patient.   Faith Rogue, MS, [facility] Genetic Counselor [email or web address] Phone: [phone]  The patient was seen for a total of 30 minutes in face-to-face genetic counseling. Patient's husband was present as well.  Dr. Grayland Ormond was available for discussion regarding this case.   _______________________________________________________________________ For Office Staff:  Number of people involved in session: 2 Was an Intern/ student involved with case: no

## 2021-11-12 ENCOUNTER — Telehealth: Payer: Self-pay | Admitting: Gastroenterology

## 2021-11-12 NOTE — Telephone Encounter (Signed)
Patient states she was speaking with a lady named Star in Endo Department and she suggested to the patient to get an upper endoscopy with her colonoscopy so now the patient is wondering if you can do an upper endoscopy tomorrow with her colonoscopy. Patient also states that if you do not have time to do both tomorrow but you are willing to reschedule and do both then that is fine. Please return patients call.   Colonoscopy tomorrow (11/13/2021)

## 2021-11-12 NOTE — Telephone Encounter (Signed)
Yes please add

## 2021-11-13 ENCOUNTER — Other Ambulatory Visit: Payer: Self-pay

## 2021-11-13 ENCOUNTER — Ambulatory Visit: Payer: Medicare HMO | Admitting: Anesthesiology

## 2021-11-13 ENCOUNTER — Encounter: Payer: Self-pay | Admitting: Gastroenterology

## 2021-11-13 ENCOUNTER — Encounter
Admission: RE | Disposition: A | Discharge status: Home / Self Care | Payer: Self-pay | Source: Home / Self Care | Attending: Gastroenterology

## 2021-11-13 ENCOUNTER — Ambulatory Visit
Admission: RE | Admit: 2021-11-13 | Discharge: 2021-11-13 | Disposition: A | Discharge status: Home / Self Care | Payer: Medicare HMO | Attending: Gastroenterology | Admitting: Gastroenterology

## 2021-11-13 DIAGNOSIS — E669 Obesity, unspecified: Secondary | ICD-10-CM | POA: Insufficient documentation

## 2021-11-13 DIAGNOSIS — Z6833 Body mass index (BMI) 33.0-33.9, adult: Secondary | ICD-10-CM | POA: Insufficient documentation

## 2021-11-13 DIAGNOSIS — Z86010 Personal history of colonic polyps: Secondary | ICD-10-CM | POA: Diagnosis not present

## 2021-11-13 DIAGNOSIS — D122 Benign neoplasm of ascending colon: Secondary | ICD-10-CM | POA: Insufficient documentation

## 2021-11-13 DIAGNOSIS — K219 Gastro-esophageal reflux disease without esophagitis: Secondary | ICD-10-CM | POA: Diagnosis not present

## 2021-11-13 DIAGNOSIS — K5730 Diverticulosis of large intestine without perforation or abscess without bleeding: Secondary | ICD-10-CM | POA: Insufficient documentation

## 2021-11-13 DIAGNOSIS — Z1211 Encounter for screening for malignant neoplasm of colon: Secondary | ICD-10-CM | POA: Insufficient documentation

## 2021-11-13 DIAGNOSIS — Z800 Family history of malignant neoplasm of digestive organs: Secondary | ICD-10-CM | POA: Insufficient documentation

## 2021-11-13 DIAGNOSIS — R12 Heartburn: Secondary | ICD-10-CM | POA: Insufficient documentation

## 2021-11-13 DIAGNOSIS — G8929 Other chronic pain: Secondary | ICD-10-CM | POA: Insufficient documentation

## 2021-11-13 DIAGNOSIS — K2950 Unspecified chronic gastritis without bleeding: Secondary | ICD-10-CM | POA: Insufficient documentation

## 2021-11-13 DIAGNOSIS — D126 Benign neoplasm of colon, unspecified: Secondary | ICD-10-CM | POA: Diagnosis not present

## 2021-11-13 DIAGNOSIS — K635 Polyp of colon: Secondary | ICD-10-CM | POA: Diagnosis not present

## 2021-11-13 DIAGNOSIS — D120 Benign neoplasm of cecum: Secondary | ICD-10-CM | POA: Insufficient documentation

## 2021-11-13 DIAGNOSIS — K2970 Gastritis, unspecified, without bleeding: Secondary | ICD-10-CM | POA: Diagnosis not present

## 2021-11-13 HISTORY — PX: ESOPHAGOGASTRODUODENOSCOPY: SHX5428

## 2021-11-13 HISTORY — PX: COLONOSCOPY WITH PROPOFOL: SHX5780

## 2021-11-13 HISTORY — DX: Gastro-esophageal reflux disease without esophagitis: K21.9

## 2021-11-13 SURGERY — COLONOSCOPY WITH PROPOFOL
Anesthesia: General

## 2021-11-13 MED ORDER — PROPOFOL 10 MG/ML IV BOLUS
INTRAVENOUS | Status: DC | PRN
  Administered 2021-11-13 (×2): 40 mg via INTRAVENOUS
  Administered 2021-11-13: 30 mg via INTRAVENOUS
  Administered 2021-11-13 (×3): 40 mg via INTRAVENOUS
  Administered 2021-11-13: 100 mg via INTRAVENOUS
  Administered 2021-11-13: 50 mg via INTRAVENOUS
  Administered 2021-11-13: 40 mg via INTRAVENOUS

## 2021-11-13 MED ORDER — LIDOCAINE HCL (PF) 1 % IJ SOLN
INTRAMUSCULAR | Status: AC
  Administered 2021-11-13: 0.5 mL
  Filled 2021-11-13: qty 2

## 2021-11-13 MED ORDER — SODIUM CHLORIDE 0.9 % IV SOLN: INTRAVENOUS | Status: DC

## 2021-11-13 NOTE — Anesthesia Preprocedure Evaluation (Addendum)
Anesthesia Evaluation  Patient identified by MRN, date of birth, ID band Patient awake    Reviewed: Allergy & Precautions, NPO status , Patient's Chart, lab work & pertinent test results  History of Anesthesia Complications Negative for: history of anesthetic complications  Airway Mallampati: II   Neck ROM: Full    Dental  (+) Missing   Pulmonary neg pulmonary ROS,    Pulmonary exam normal breath sounds clear to auscultation       Cardiovascular Exercise Tolerance: Good negative cardio ROS Normal cardiovascular exam Rhythm:Regular Rate:Normal     Neuro/Psych  Headaches, Chronic pain    GI/Hepatic GERD  ,  Endo/Other  Obesity   Renal/GU negative Renal ROS     Musculoskeletal   Abdominal   Peds  Hematology negative hematology ROS (+)   Anesthesia Other Findings   Reproductive/Obstetrics                            Anesthesia Physical Anesthesia Plan  ASA: 2  Anesthesia Plan: General   Post-op Pain Management:    Induction: Intravenous  PONV Risk Score and Plan: 3 and Propofol infusion, TIVA and Treatment may vary due to age or medical condition  Airway Management Planned: Natural Airway  Additional Equipment:   Intra-op Plan:   Post-operative Plan:   Informed Consent: I have reviewed the patients History and Physical, chart, labs and discussed the procedure including the risks, benefits and alternatives for the proposed anesthesia with the patient or authorized representative who has indicated his/her understanding and acceptance.       Plan Discussed with: CRNA  Anesthesia Plan Comments: (LMA/GETA backup discussed.  Patient consented for risks of anesthesia including but not limited to:  - adverse reactions to medications - damage to eyes, teeth, lips or other oral mucosa - nerve damage due to positioning  - sore throat or hoarseness - damage to heart, brain,  nerves, lungs, other parts of body or loss of life  Informed patient about role of CRNA in peri- and intra-operative care.  Patient voiced understanding.)        Anesthesia Quick Evaluation

## 2021-11-13 NOTE — Op Note (Signed)
[facility] Gastroenterology Patient Name: Robin Ochoa Procedure Date: 11/13/2021 8:30 AM MRN: [id number] Account #: [id number] Date of Birth: [birth year] Admit Type: Outpatient Age: 66 Room: [facility] ENDO ROOM 3 Gender: Female Note Status: Finalized Instrument Name: Michaelle Birks [id number] Procedure:             Upper GI endoscopy Indications:           Heartburn Providers:             Alfredia Ferguson, MD                        Jonathon Bellows MD, MD Referring MD:          Wonda Cheng. Gale Journey (Referring MD) Medicines:             Monitored Anesthesia Care Complications:         No immediate complications. Procedure:             Pre-Anesthesia Assessment:                        - Prior to the procedure, a History and Physical was                         performed, and patient medications, allergies and                         sensitivities were reviewed. The patient's tolerance                         of previous anesthesia was reviewed.                        - The risks and benefits of the procedure and the                         sedation options and risks were discussed with the                         patient. All questions were answered and informed                         consent was obtained.                        - ASA Grade Assessment: II - A patient with mild                         systemic disease.                        After obtaining informed consent, the endoscope was                         passed under direct vision. Throughout the procedure,                         the patient's blood pressure, pulse, and oxygen                         saturations were monitored continuously. The Endoscope  was introduced through the mouth, and advanced to the                         third part of duodenum. The upper GI endoscopy was                         accomplished with ease. The patient tolerated the                         procedure well. Findings:       The examined esophagus was normal. Biopsies were taken with a cold       forceps for histology.      Localized moderate inflammation characterized by congestion (edema) and       erythema was found on the greater curvature of the stomach. Biopsies       were taken with a cold forceps for histology.      The examined duodenum was normal.      The cardia and gastric fundus were normal on retroflexion. Impression:            - Normal esophagus. Biopsied.                        - Gastritis. Biopsied.                        - Normal examined duodenum. Recommendation:        - Await pathology results.                        - Perform a colonoscopy today. Procedure Code(s):     --- Professional ---                        [id number], Esophagogastroduodenoscopy, flexible,                         transoral; with biopsy, single or multiple Diagnosis Code(s):     --- Professional ---                        K29.70, Gastritis, unspecified, without bleeding                        R12, Heartburn CPT copyright 2019 American Medical Association. All rights reserved. The codes documented in this report are preliminary and upon coder review may  be revised to meet current compliance requirements. Jonathon Bellows, MD Jonathon Bellows MD, MD 11/13/2021 8:56:03 AM This report has been signed electronically. Number of Addenda: 0 Note Initiated On: 11/13/2021 8:30 AM Estimated Blood Loss:  Estimated blood loss: none.      [facility]

## 2021-11-13 NOTE — Anesthesia Postprocedure Evaluation (Signed)
Anesthesia Post Note  Patient: Robin Ochoa  Procedure(s) Performed: COLONOSCOPY WITH PROPOFOL ESOPHAGOGASTRODUODENOSCOPY (EGD)  Patient location during evaluation: PACU Anesthesia Type: General Level of consciousness: awake and alert, oriented and patient cooperative Pain management: pain level controlled Vital Signs Assessment: post-procedure vital signs reviewed and stable Respiratory status: spontaneous breathing, nonlabored ventilation and respiratory function stable Cardiovascular status: blood pressure returned to baseline and stable Postop Assessment: adequate PO intake Anesthetic complications: no   No notable events documented.   Last Vitals:  Vitals:   11/13/21 0931 11/13/21 0941  BP: 108/86 120/78  Pulse: (!) 56   Resp: 14   Temp:    SpO2: 99% 99%    Last Pain:  Vitals:   11/13/21 0931  TempSrc:   PainSc: 0-No pain                 Darrin Nipper

## 2021-11-13 NOTE — Transfer of Care (Signed)
Immediate Anesthesia Transfer of Care Note  Patient: Robin Ochoa  Procedure(s) Performed: COLONOSCOPY WITH PROPOFOL ESOPHAGOGASTRODUODENOSCOPY (EGD)  Patient Location: Endoscopy Unit  Anesthesia Type:General  Level of Consciousness: drowsy  Airway & Oxygen Therapy: Patient Spontanous Breathing and Patient connected to nasal cannula oxygen  Post-op Assessment: Report given to RN, Post -op Vital signs reviewed and stable and Patient moving all extremities  Post vital signs: Reviewed and stable  Last Vitals:  Vitals Value Taken Time  BP 112/62 11/13/21 0921  Temp 35.6 C 11/13/21 0921  Pulse 61 11/13/21 0921  Resp 21 11/13/21 0921  SpO2 96 % 11/13/21 0921    Last Pain:  Vitals:   11/13/21 0921  TempSrc: Temporal  PainSc: Asleep         Complications: No notable events documented.

## 2021-11-13 NOTE — H&P (Signed)
Jonathon Bellows, MD [address], [place], [place], [place], [address] [address], [place], [place], [place], [address] Phone: [phone]  Fax: [phone]  Primary Care Physician:  Romualdo Bolk, FNP   Pre-Procedure History & Physical: HPI:  Robin Ochoa is a 66 y.o. female is here for an endoscopy and colonoscopy    Past Medical History:  Diagnosis Date   GERD (gastroesophageal reflux disease)    Migraine    Neck pain, chronic    Spinal stenosis     Past Surgical History:  Procedure Laterality Date   BACK SURGERY     COLONOSCOPY WITH PROPOFOL N/A 10/07/2020   Procedure: COLONOSCOPY WITH PROPOFOL;  Surgeon: Jonathon Bellows, MD;  Location[facility] ENDOSCOPY;  Service: Gastroenterology;  Laterality: N/A;   COLONOSCOPY WITH PROPOFOL N/A 11/14/2020   Procedure: COLONOSCOPY WITH PROPOFOL;  Surgeon: Jonathon Bellows, MD;  Location: [facility] ENDOSCOPY;  Service: Gastroenterology;  Laterality: N/A;   ESOPHAGOGASTRODUODENOSCOPY (EGD) WITH PROPOFOL N/A 05/16/2021   Procedure: ESOPHAGOGASTRODUODENOSCOPY (EGD) WITH PROPOFOL;  Surgeon: Jonathon Bellows, MD;  Location: [facility] ENDOSCOPY;  Service: Gastroenterology;  Laterality: N/A;   VAGINAL HYSTERECTOMY  66 years old   Tumors-     Prior to Admission medications   Medication Sig Start Date End Date Taking? Authorizing Provider  aspirin EC 81 MG tablet Take 1 tablet by mouth daily.   Yes [provider]  buPROPion (WELLBUTRIN XL) 150 MG 24 hr tablet Take 1 tablet by mouth daily as needed. 08/05/20  Yes [provider]  Diphenhyd-Hydrocort-Nystatin (FIRST-DUKES MOUTHWASH MT) Take by mouth. 10/15/21  Yes [provider]  hydrochlorothiazide (HYDRODIURIL) 25 MG tablet Take 1 tablet (25 mg total) by mouth daily as needed. 06/22/17  Yes Minna Merritts, MD  HYDROcodone-acetaminophen (NORCO/VICODIN) 5-325 MG tablet Take by mouth. 03/15/18  Yes [provider]  omeprazole (PRILOSEC) 20 MG capsule Take 20 mg by mouth 2 (two)  times daily. 10/15/21  Yes [provider]  pregabalin (LYRICA) 100 MG capsule Take 100 mg by mouth 2 (two) times daily. 04/10/21  Yes [provider]  sertraline (ZOLOFT) 100 MG tablet Take by mouth. 04/22/21  Yes [provider]  tiZANidine (ZANAFLEX) 4 MG tablet Take 4 mg by mouth every 6 (six) hours as needed for muscle spasms.   Yes [provider]    Allergies as of 10/30/2021 - Review Complete 10/29/2021  Allergen Reaction Noted   Ibuprofen Other (See Comments) 10/17/2014   Penicillins Rash 10/17/2014    Family History  Problem Relation Age of Onset   Heart failure Mother    Stomach cancer Father 62   Ovarian cancer Maternal Aunt    Ovarian cancer Maternal Aunt    Cancer Maternal Aunt        unk type; female origin   Lung cancer Paternal Uncle    Stomach cancer Paternal Grandmother        d. 39   Stomach cancer Other    Colon cancer Cousin        2 mat cousins; both d. 70s   Breast cancer Cousin        maternal cousin dx late 40s    Social History   Socioeconomic History   Marital status: Married    Spouse name: Not on file   Number of children: Not on file   Years of education: Not on file   Highest education level: Not on file  Occupational History   Not on file  Tobacco  Use   Smoking status: Never   Smokeless tobacco: Never  Vaping Use   Vaping Use: Never used  Substance and Sexual Activity   Alcohol use: Not Currently    Comment: occ   Drug use: No   Sexual activity: Not on file  Other Topics Concern   Not on file  Social History Narrative   Not on file   Social Determinants of Health   Financial Resource Strain: Not on file  Food Insecurity: Not on file  Transportation Needs: Not on file  Physical Activity: Not on file  Stress: Not on file  Social Connections: Not on file  Intimate Partner Violence: Not on file    Review of Systems: See HPI, otherwise negative ROS  Physical Exam: BP (!) 150/86    Pulse 77   Temp (!) 97.3 F (36.3 C) (Temporal)   Resp 16   Ht '5\' 3"'$  (1.6 m)   Wt 86.2 kg   SpO2 97%   BMI 33.66 kg/m  General:   Alert,  pleasant and cooperative in NAD Head:  Normocephalic and atraumatic. Neck:  Supple; no masses or thyromegaly. Lungs:  Clear throughout to auscultation, normal respiratory effort.    Heart:  +S1, +S2, Regular rate and rhythm, No edema. Abdomen:  Soft, nontender and nondistended. Normal bowel sounds, without guarding, and without rebound.   Neurologic:  Alert and  oriented x4;  grossly normal neurologically.  Impression/Plan: Robin Ochoa is here for an endoscopy and colonoscopy  to be performed for  evaluation of heartburn and colon cancer screening     Risks, benefits, limitations, and alternatives regarding endoscopy have been reviewed with the patient.  Questions have been answered.  All parties agreeable.   Jonathon Bellows, MD  11/13/2021, 8:22 AM

## 2021-11-13 NOTE — Op Note (Signed)
[facility] Gastroenterology Patient Name: Robin Ochoa Procedure Date: 11/13/2021 8:26 AM MRN: [id number] Account #: [id number] Date of Birth: 24-Sep-1955 Admit Type: Outpatient Age: 66 Room: [facility] ENDO ROOM 3 Gender: Female Note Status: Finalized Instrument Name: Park Meo [id number] Procedure:             Colonoscopy Indications:           Surveillance: History of numerous (> 10) adenomas on                         last colonoscopy (< 3 yrs) Providers:             Jonathon Bellows MD, MD Referring MD:          Wonda Cheng. Gale Journey (Referring MD) Medicines:             Monitored Anesthesia Care Complications:         No immediate complications. Procedure:             Pre-Anesthesia Assessment:                        - Prior to the procedure, a History and Physical was                         performed, and patient medications, allergies and                         sensitivities were reviewed. The patient's tolerance                         of previous anesthesia was reviewed.                        - The risks and benefits of the procedure and the                         sedation options and risks were discussed with the                         patient. All questions were answered and informed                         consent was obtained.                        - ASA Grade Assessment: II - A patient with mild                         systemic disease.                        After obtaining informed consent, the colonoscope was                         passed under direct vision. Throughout the procedure,                         the patient's blood pressure, pulse, and oxygen  saturations were monitored continuously. The                         Colonoscope was introduced through the anus and                         advanced to the the cecum, identified by the                         appendiceal orifice. The colonoscopy was performed                          with ease. The patient tolerated the procedure well.                         The quality of the bowel preparation was excellent. Findings:      The perianal and digital rectal examinations were normal.      Multiple small and large-mouthed diverticula were found in the left       colon.      Two sessile polyps were found in the cecum. The polyps were 4 to 5 mm in       size. These polyps were removed with a cold biopsy forceps. Resection       and retrieval were complete.      Two sessile polyps were found in the ascending colon. The polyps were 4       to 6 mm in size. These polyps were removed with a cold snare. Resection       and retrieval were complete.      Two sessile polyps were found in the ascending colon. The polyps were 4       to 5 mm in size. These polyps were removed with a cold biopsy forceps.       Resection and retrieval were complete.      The exam was otherwise without abnormality on direct and retroflexion       views. Impression:            - Diverticulosis in the left colon.                        - Two 4 to 5 mm polyps in the cecum, removed with a                         cold biopsy forceps. Resected and retrieved.                        - Two 4 to 6 mm polyps in the ascending colon, removed                         with a cold snare. Resected and retrieved.                        - Two 4 to 5 mm polyps in the ascending colon, removed                         with a cold biopsy forceps. Resected and retrieved.                        -  The examination was otherwise normal on direct and                         retroflexion views. Recommendation:        - Discharge patient to home (with escort).                        - Resume previous diet.                        - Continue present medications.                        - Await pathology results.                        - Repeat colonoscopy in 3 years for surveillance. Procedure Code(s):     --- Professional ---                         [phone], Colonoscopy, flexible; with removal of                         tumor(s), polyp(s), or other lesion(s) by snare                         technique                        45380, 20, Colonoscopy, flexible; with biopsy, single                         or multiple Diagnosis Code(s):     --- Professional ---                        Z86.010, Personal history of colonic polyps                        K63.5, Polyp of colon                        K57.30, Diverticulosis of large intestine without                         perforation or abscess without bleeding CPT copyright 2019 American Medical Association. All rights reserved. The codes documented in this report are preliminary and upon coder review may  be revised to meet current compliance requirements. Jonathon Bellows, MD Jonathon Bellows MD, MD 11/13/2021 9:22:56 AM This report has been signed electronically. Number of Addenda: 0 Note Initiated On: 11/13/2021 8:26 AM Total Procedure Duration: 0 hours 20 minutes 17 seconds  Estimated Blood Loss:  Estimated blood loss: none.      [facility]

## 2021-11-14 ENCOUNTER — Encounter: Payer: Self-pay | Admitting: Gastroenterology

## 2021-11-14 LAB — SURGICAL PATHOLOGY

## 2021-11-18 ENCOUNTER — Encounter: Payer: Self-pay | Admitting: Gastroenterology

## 2021-11-19 ENCOUNTER — Telehealth: Payer: Self-pay | Admitting: Licensed Clinical Social Worker

## 2021-11-24 NOTE — Telephone Encounter (Signed)
Revealed negative genetic testing.  This normal result is reassuring.  It is unlikely that there is an increased risk of cancer due to a mutation in one of these genes.  However, genetic testing is not perfect, and cannot definitively rule out a hereditary cause.  It will be important for her to keep in contact with genetics to learn if any additional testing may be needed in the future.      

## 2021-11-25 ENCOUNTER — Encounter: Payer: Self-pay | Admitting: Licensed Clinical Social Worker

## 2021-11-25 ENCOUNTER — Ambulatory Visit: Payer: Self-pay | Admitting: Licensed Clinical Social Worker

## 2021-11-25 DIAGNOSIS — Z1379 Encounter for other screening for genetic and chromosomal anomalies: Secondary | ICD-10-CM | POA: Insufficient documentation

## 2021-11-25 NOTE — Progress Notes (Signed)
HPI:  Robin Ochoa. Holecek was previously seen in the [place] clinic due to a personal history of colon polyps and concerns regarding a hereditary predisposition to cancer. Please refer to our prior cancer genetics clinic note for more information regarding our discussion, assessment and recommendations, at the time. Robin Ochoa. Duba recent genetic test results were disclosed to her, as were recommendations warranted by these results. These results and recommendations are discussed in more detail below.  CANCER HISTORY:  Oncology History   No history exists.    FAMILY HISTORY:  We obtained a detailed, 4-generation family history.  Significant diagnoses are listed below: Family History  Problem Relation Age of Onset   Heart failure Mother    Stomach cancer Father 6   Ovarian cancer Maternal Aunt    Ovarian cancer Maternal Aunt    Cancer Maternal Aunt        unk type; female origin   Lung cancer Paternal Uncle    Stomach cancer Paternal Grandmother        d. 1   Stomach cancer Other    Colon cancer Cousin        2 mat cousins; both d. 77s   Breast cancer Cousin        maternal cousin dx late 18s   Robin Ochoa. Matsuoka has 1 son, 7 and 1 daughter, 11.    Robin Ochoa. Jager mother passed at 45. Patient had 2 maternal aunts that had ovarian cancer. Another aunt had cancer, unknown primary, but thought to be GYN origin. Patient also has 2 maternal cousins who passed of colon cancer in their late 13s and a cousin who had breast cancer in her late 31s. Maternal grandparents passed over age 79.   Robin Ochoa. Tappen father passed of stomach cancer at 40. Patient had a paternal uncle who passed of lung cancer in his 66s and a cousin who also passed of lung cancer. Paternal grandmother passed at 18 of stomach cancer and her sister also passed of stomach cancer.    Robin Ochoa. Kamaka is unaware of previous family history of genetic testing for hereditary cancer risks. There is no reported Ashkenazi Jewish ancestry. There is no known  consanguinity.     GENETIC TEST RESULTS: Genetic testing reported out on 11/19/2021 through the [facility] Multi-Cancer+RNA cancer panel found no pathogenic mutations.   The Multi-Cancer Panel + RNA offered by [facility] includes sequencing and/or deletion duplication testing of the following 84 genes: AIP, ALK, APC, ATM, AXIN2,BAP1,  BARD1, BLM, BMPR1A, BRCA1, BRCA2, BRIP1, CASR, CDC73, CDH1, CDK4, CDKN1B, CDKN1C, CDKN2A (p14ARF), CDKN2A (p16INK4a), CEBPA, CHEK2, CTNNA1, DICER1, DIS3L2, EGFR (c.2369C>T, p.Thr790Met variant only), EPCAM (Deletion/duplication testing only), FH, FLCN, GATA2, GPC3, GREM1 (Promoter region deletion/duplication testing only), HOXB13 (c.251G>A, p.Gly84Glu), HRAS, KIT, MAX, MEN1, MET, MITF (c.952G>A, p.Glu318Lys variant only), MLH1, MSH2, MSH3, MSH6, MUTYH, NBN, NF1, NF2, NTHL1, PALB2, PDGFRA, PHOX2B, PMS2, POLD1, POLE, POT1, PRKAR1A, PTCH1, PTEN, RAD50, RAD51C, RAD51D, RB1, RECQL4, RET, RUNX1, SDHAF2, SDHA (sequence changes only), SDHB, SDHC, SDHD, SMAD4, SMARCA4, SMARCB1, SMARCE1, STK11, SUFU, TERC, TERT, TMEM127, TP53, TSC1, TSC2, VHL, WRN and WT1.   The test report has been scanned into EPIC and is located under the Molecular Pathology section of the Results Review tab.  A portion of the result report is included below for reference.     We discussed that because current genetic testing is not perfect, it is possible there may be a gene mutation in one of these genes that current testing cannot detect, but that chance is  small.  There could be another gene that has not yet been discovered, or that we have not yet tested, that is responsible for the cancer diagnoses in the family. It is also possible there is a hereditary cause for the cancer in the family that Robin Ochoa. Keesling did not inherit and therefore was not identified in her testing.  Therefore, it is important to remain in touch with cancer genetics in the future so that we can continue to offer Robin Ochoa. Mccambridge the most up to date genetic  testing.   ADDITIONAL GENETIC TESTING: We discussed with Robin Ochoa. Desjardin that her genetic testing was fairly extensive.  If there are genes identified to increase cancer risk that can be analyzed in the future, we would be happy to discuss and coordinate this testing at that time.    CANCER SCREENING RECOMMENDATIONS: Robin Ochoa. Allende test result is considered negative (normal).  This means that we have not identified a hereditary cause for her family history of cancer at this time.   While reassuring, this does not definitively rule out a hereditary predisposition to cancer. It is still possible that there could be genetic mutations that are undetectable by current technology. There could be genetic mutations in genes that have not been tested or identified to increase cancer risk.  Therefore, it is recommended she continue to follow the cancer management and screening guidelines provided by her primary healthcare provider.   An individual's cancer risk and medical management are not determined by genetic test results alone. Overall cancer risk assessment incorporates additional factors, including personal medical history, family history, and any available genetic information that may result in a personalized plan for cancer prevention and surveillance.  This negative genetic test simply tells [place] that we cannot yet define why Robin Ochoa. Ackroyd has had  an increased number of colorectal polyps.  Robin Ochoa. Kolodziej medical management and screening should be based on the prospect that she will likely form more colon polyps and should, therefore, undergo more frequent colonoscopy screening at intervals determined by her GI providers.  We also recommended that Robin Ochoa. Lindy have an upper endoscopy periodically.  RECOMMENDATIONS FOR FAMILY MEMBERS:  Relatives in this family might be at some increased risk of developing cancer, over the general population risk, simply due to the family history of cancer.  We recommended female relatives in this  family have a yearly mammogram beginning at age 85, or 92 years younger than the earliest onset of cancer, an annual clinical breast exam, and perform monthly breast self-exams. Female relatives in this family should also have a gynecological exam as recommended by their primary provider.  All family members should be referred for colonoscopy starting at age 2.    It is also possible there is a hereditary cause for the cancer in Robin Ochoa. Salce's family that she did not inherit and therefore was not identified in her.  Based on Robin Ochoa. Ferrall's family history, we recommended maternal relatives have genetic counseling and testing. Robin Ochoa. Pinkham will let us know if we can be of any assistance in coordinating genetic counseling and/or testing for these family members.  FOLLOW-UP: Lastly, we discussed with Robin Ochoa. Middleton that cancer genetics is a rapidly advancing field and it is possible that new genetic tests will be appropriate for her and/or her family members in the future. We encouraged her to remain in contact with cancer genetics on an annual basis so we can update her personal and family histories and let her know of advances in cancer genetics that  may benefit this family.   Our contact number was provided. Robin Ochoa. Dimmitt questions were answered to her satisfaction, and she knows she is welcome to call us at anytime with additional questions or concerns.   Robin Rogue, Robin Ochoa, [facility] Genetic Counselor [email or web address] Phone: [phone]

## 2021-11-26 DIAGNOSIS — Z79891 Long term (current) use of opiate analgesic: Secondary | ICD-10-CM | POA: Diagnosis not present

## 2021-11-26 DIAGNOSIS — M47816 Spondylosis without myelopathy or radiculopathy, lumbar region: Secondary | ICD-10-CM | POA: Diagnosis not present

## 2021-11-26 DIAGNOSIS — M5416 Radiculopathy, lumbar region: Secondary | ICD-10-CM | POA: Diagnosis not present

## 2021-11-27 DIAGNOSIS — Z1231 Encounter for screening mammogram for malignant neoplasm of breast: Secondary | ICD-10-CM | POA: Diagnosis not present

## 2021-12-03 DIAGNOSIS — L920 Granuloma annulare: Secondary | ICD-10-CM | POA: Diagnosis not present

## 2021-12-18 DIAGNOSIS — H2513 Age-related nuclear cataract, bilateral: Secondary | ICD-10-CM | POA: Diagnosis not present

## 2021-12-18 DIAGNOSIS — Z0100 Encounter for examination of eyes and vision without abnormal findings: Secondary | ICD-10-CM | POA: Diagnosis not present

## 2021-12-18 DIAGNOSIS — H5203 Hypermetropia, bilateral: Secondary | ICD-10-CM | POA: Diagnosis not present

## 2021-12-23 DIAGNOSIS — Z981 Arthrodesis status: Secondary | ICD-10-CM | POA: Diagnosis not present

## 2021-12-23 DIAGNOSIS — Z4789 Encounter for other orthopedic aftercare: Secondary | ICD-10-CM | POA: Diagnosis not present

## 2022-01-12 ENCOUNTER — Telehealth: Payer: Self-pay | Admitting: *Deleted

## 2022-01-12 NOTE — Patient Outreach (Signed)
  Care Coordination   01/12/2022 Name: Robin Ochoa MRN: [id number] DOB: December 06, 1955   Care Coordination Outreach Attempts:  An unsuccessful telephone outreach was attempted today to offer the patient information about available care coordination services as a benefit of their health plan.   Follow Up Plan:  Additional outreach attempts will be made to offer the patient care coordination information and services.   Encounter Outcome:  No Answer  Care Coordination Interventions Activated:  No   Care Coordination Interventions:  No, not indicated    SIG Amaro Mangold C. Myrtie Neither, MSN, [facility] Gerontological Nurse Practitioner [facility] Care Management [phone]

## 2022-01-14 ENCOUNTER — Ambulatory Visit: Payer: Medicare HMO | Attending: Cardiovascular Disease | Admitting: Cardiovascular Disease

## 2022-01-14 ENCOUNTER — Encounter: Payer: Self-pay | Admitting: Cardiovascular Disease

## 2022-01-14 VITALS — BP 100/80 | HR 76 | Ht 63.0 in | Wt 187.5 lb

## 2022-01-14 DIAGNOSIS — R0602 Shortness of breath: Secondary | ICD-10-CM | POA: Diagnosis not present

## 2022-01-14 DIAGNOSIS — R600 Localized edema: Secondary | ICD-10-CM | POA: Diagnosis not present

## 2022-01-14 DIAGNOSIS — E782 Mixed hyperlipidemia: Secondary | ICD-10-CM

## 2022-01-14 DIAGNOSIS — I059 Rheumatic mitral valve disease, unspecified: Secondary | ICD-10-CM

## 2022-01-14 NOTE — Progress Notes (Signed)
Cardiology Office Note  Date:  01/14/2022   ID:  Robin Ochoa, DOB [birth year], MRN [id number]  PCP:  Romualdo Bolk, FNP   Chief Complaint  Patient presents with   New Patient (Initial Visit)    Patient c/o shortness of breath, chest pain and fluttering in chest. Medications reviewed by the patient verbally.     HPI:  Robin Ochoa is a pleasant 66 year old woman with history of obesity,  broken pelvis in 2014,  echocardiogram in 2014 suggesting moderate mitral valve regurgitation with normal ejection fraction (read by Dr. Louisa Second),  who presents for f/u of her  mitral valve disease , shortness of breath, fatigue  Last seen in clinic by myself February 2019 Increased SOB, fatigue Insidious onset over the past several years Had covid x 2 , in 2/22, 05/2021 Spinal fusion in 2022, got depressed  [place] Sedentary, "I love the couch" Lost 20 pounds Getting over flu 2 weeks ago [place] last week No regular exercise program  BP low today Takes HCTZ for ankle swelling approximately once a week  EKG personally reviewed by myself on todays visit Shows normal sinus rhythm with rate 76 bpm no significant ST or T wave changes   Suffered a fall in 2014, broke her pelvis   Echocardiogram in 2014 with normal ejection fraction, mild TR, moderate MR (read by outside physician)   Family history; mother died at age 83, father died age 59 from stomach cancer  PMH:   has a past medical history of GERD (gastroesophageal reflux disease), Migraine, Neck pain, chronic, and Spinal stenosis.  PSH:    Past Surgical History:  Procedure Laterality Date   BACK SURGERY     COLONOSCOPY WITH PROPOFOL N/A 10/07/2020   Procedure: COLONOSCOPY WITH PROPOFOL;  Surgeon: Jonathon Bellows, MD;  Location: [facility] ENDOSCOPY;  Service: Gastroenterology;  Laterality: N/A;   COLONOSCOPY WITH PROPOFOL N/A 11/14/2020   Procedure: COLONOSCOPY WITH PROPOFOL;  Surgeon: Jonathon Bellows, MD;  Location: [facility] ENDOSCOPY;   Service: Gastroenterology;  Laterality: N/A;   COLONOSCOPY WITH PROPOFOL N/A 11/13/2021   Procedure: COLONOSCOPY WITH PROPOFOL;  Surgeon: Jonathon Bellows, MD;  Location: [facility] ENDOSCOPY;  Service: Gastroenterology;  Laterality: N/A;  3rd case   ESOPHAGOGASTRODUODENOSCOPY  11/13/2021   Procedure: ESOPHAGOGASTRODUODENOSCOPY (EGD);  Surgeon: Jonathon Bellows, MD;  Location: [facility] ENDOSCOPY;  Service: Gastroenterology;;   ESOPHAGOGASTRODUODENOSCOPY (EGD) WITH PROPOFOL N/A 05/16/2021   Procedure: ESOPHAGOGASTRODUODENOSCOPY (EGD) WITH PROPOFOL;  Surgeon: Jonathon Bellows, MD;  Location: [facility] ENDOSCOPY;  Service: Gastroenterology;  Laterality: N/A;   VAGINAL HYSTERECTOMY  66 years old   Tumors-     Current Outpatient Medications  Medication Sig Dispense Refill   aspirin EC 81 MG tablet Take 1 tablet by mouth daily.     buPROPion (WELLBUTRIN XL) 150 MG 24 hr tablet Take 1 tablet by mouth daily as needed.     Diphenhyd-Hydrocort-Nystatin (FIRST-DUKES MOUTHWASH MT) Take by mouth.     hydrochlorothiazide (HYDRODIURIL) 25 MG tablet Take 1 tablet (25 mg total) by mouth daily as needed. 30 tablet 6   HYDROcodone-acetaminophen (NORCO/VICODIN) 5-325 MG tablet Take by mouth.     lidocaine (LIDODERM) 5 % Place onto the skin.     omeprazole (PRILOSEC) 20 MG capsule Take 20 mg by mouth 2 (two) times daily.     pregabalin (LYRICA) 100 MG capsule Take 100 mg by mouth 2 (two) times daily.     sertraline (ZOLOFT) 100 MG tablet Take by mouth.     tiZANidine (ZANAFLEX)  4 MG tablet Take 4 mg by mouth every 6 (six) hours as needed for muscle spasms.     No current facility-administered medications for this visit.     Allergies:   Ibuprofen and Penicillins   Social History:  The patient  reports that she has never smoked. She has never used smokeless tobacco. She reports that she does not currently use alcohol. She reports that she does not use drugs.   Family History:   family history includes Breast cancer in her cousin; Cancer  in her maternal aunt; Colon cancer in her cousin; Heart failure in her mother; Lung cancer in her paternal uncle; Ovarian cancer in her maternal aunt and maternal aunt; Stomach cancer in her paternal grandmother and another family member; Stomach cancer (age of onset: 78) in her father.    Review of Systems: Review of Systems  Constitutional: Negative.   HENT: Negative.    Respiratory: Negative.    Cardiovascular: Negative.   Gastrointestinal: Negative.   Musculoskeletal: Negative.   Neurological: Negative.   Psychiatric/Behavioral: Negative.    All other systems reviewed and are negative.   PHYSICAL EXAM: VS:  BP 100/80 (BP Location: Right Arm, Patient Position: Sitting, Cuff Size: Normal)   Pulse 76   Ht '5\' 3"'$  (1.6 m)   Wt 187 lb 8 oz (85 kg)   SpO2 99%   BMI 33.21 kg/m  , BMI Body mass index is 33.21 kg/m. Constitutional:  oriented to person, place, and time. No distress.  Obese HENT:  Head: Grossly normal Eyes:  no discharge. No scleral icterus.  Neck: No JVD, no carotid bruits  Cardiovascular: Regular rate and rhythm, no murmurs appreciated Pulmonary/Chest: Clear to auscultation bilaterally, no wheezes or rails Abdominal: Soft.  no distension.  no tenderness.  Musculoskeletal: Normal range of motion Neurological:  normal muscle tone. Coordination normal. No atrophy Skin: Skin warm and dry Psychiatric: normal affect, pleasant   Recent Labs: No results found for requested labs within last 365 days.    Lipid Panel Lab Results  Component Value Date   CHOL 197 11/20/2015   HDL 41 (L) 11/20/2015   LDLCALC NOT CALC 11/20/2015   TRIG 504 (H) 11/20/2015      Wt Readings from Last 3 Encounters:  01/14/22 187 lb 8 oz (85 kg)  11/13/21 190 lb (86.2 kg)  10/29/21 192 lb 12.8 oz (87.5 kg)       ASSESSMENT AND PLAN:  Problem List Items Addressed This Visit       Cardiology Problems   Mixed hyperlipidemia   Relevant Orders   EKG 12-Lead   CT CARDIAC SCORING  (SELF PAY ONLY)   Mitral valve disease - Primary   Relevant Orders   EKG 12-Lead   ECHOCARDIOGRAM COMPLETE   Other Visit Diagnoses     Lower extremity edema       Relevant Orders   EKG 12-Lead   Shortness of breath       Relevant Orders   ECHOCARDIOGRAM COMPLETE      Shortness of breath Prior history of reported mitral valve regurgitation, outside echocardiogram performed 2014 No appreciable murmur on exam Recommend repeat echocardiogram for further evaluation Recommended exercise program, gentle weight loss , conditioning Calcium score ordered for risk stratification  Hyperlipidemia Calcium score ordered to help guide management  Fatigue Recommend regular walking program for conditioning Lives on [place] acre property In general is sedentary    Total encounter time more than 50 minutes  Greater than 50% was spent  in counseling and coordination of care with the patient    Signed, Esmond Plants, M.D., Ph.D. [place], [place]

## 2022-01-14 NOTE — Patient Instructions (Addendum)
Medication Instructions:  No changes  If you need a refill on your cardiac medications before your next appointment, please call your pharmacy.    Lab work: No new labs needed   Testing/Procedures:  1) Echocardiogram: (mitral valve disorder, shortness of breath) - Your physician has requested that you have an echocardiogram. Echocardiography is a painless test that uses sound waves to create images of your heart. It provides your doctor with information about the size and shape of your heart and how well your heart's chambers and valves are working. This procedure takes approximately one hour. There are no restrictions for this procedure. There is a possibility that an IV may need to be started during your test to inject an image enhancing agent. This is done to obtain more optimal pictures of your heart. Therefore we ask that you do at least drink some water prior to coming in to hydrate your veins.     2) CT coronary calcium score: (high cholesterol) Your physician has recommended that you have CT Coronary Calcium Score.  - $99 out of pocket cost at the time of your test - Call [phone] to schedule at your convenience.  Location: [address], [place] [address]   Follow-Up: At [facility], you and your health needs are our priority.  As part of our continuing mission to provide you with exceptional heart care, we have created designated Provider Care Teams.  These Care Teams include your primary Cardiologist (physician) and Advanced Practice Providers (APPs -  Physician Assistants and Nurse Practitioners) who all work together to provide you with the care you need, when you need it.  You will need a follow up appointment as needed  Providers on your designated Care Team:   Murray Hodgkins, NP Christell Faith, PA-C Cadence Kathlen Mody, [place]  COVID-19 Vaccine Information can be found at:  [email or web address].[email or web address].uk For questions related to vaccine distribution or appointments, please email [email or web address] or call [phone].    Echocardiogram An echocardiogram is a test that uses sound waves (ultrasound) to produce images of the heart. Images from an echocardiogram can provide important information about: Heart size and shape. The size and thickness and movement of your heart's walls. Heart muscle function and strength. Heart valve function or if you have stenosis. Stenosis is when the heart valves are too narrow. If blood is flowing backward through the heart valves (regurgitation). A tumor or infectious growth around the heart valves. Areas of heart muscle that are not working well because of poor blood flow or injury from a heart attack. Aneurysm detection. An aneurysm is a weak or damaged part of an artery wall. The wall bulges out from the normal force of blood pumping through the body. Tell a health care provider about: Any allergies you have. All medicines you are taking, including vitamins, herbs, eye drops, creams, and over-the-counter medicines. Any blood disorders you have. Any surgeries you have had. Any medical conditions you have. Whether you are pregnant or may be pregnant. What are the risks? Generally, this is a safe test. However, problems may occur, including an allergic reaction to dye (contrast) that may be used during the test. What happens before the test? No specific preparation is needed. You may eat and drink normally. What happens during the test?  You will take off your clothes from the waist up and put on a hospital gown. Electrodes or electrocardiogram (ECG)patches may be placed on your chest. The electrodes or patches are then  connected to a device that monitors your heart rate and rhythm. You will lie down on a table for an ultrasound exam. A gel will be applied to your chest to  help sound waves pass through your skin. A handheld device, called a transducer, will be pressed against your chest and moved over your heart. The transducer produces sound waves that travel to your heart and bounce back (or "echo" back) to the transducer. These sound waves will be captured in real-time and changed into images of your heart that can be viewed on a video monitor. The images will be recorded on a computer and reviewed by your health care provider. You may be asked to change positions or hold your breath for a short time. This makes it easier to get different views or better views of your heart. In some cases, you may receive contrast through an IV in one of your veins. This can improve the quality of the pictures from your heart. The procedure may vary among health care providers and hospitals. What can I expect after the test? You may return to your normal, everyday life, including diet, activities, and medicines, unless your health care provider tells you not to do that. Follow these instructions at home: It is up to you to get the results of your test. Ask your health care provider, or the department that is doing the test, when your results will be ready. Keep all follow-up visits. This is important. Summary An echocardiogram is a test that uses sound waves (ultrasound) to produce images of the heart. Images from an echocardiogram can provide important information about the size and shape of your heart, heart muscle function, heart valve function, and other possible heart problems. You do not need to do anything to prepare before this test. You may eat and drink normally. After the echocardiogram is completed, you may return to your normal, everyday life, unless your health care provider tells you not to do that. This information is not intended to replace advice given to you by your health care provider. Make sure you discuss any questions you have with your health care  provider. Document Revised: 12/25/2020 Document Reviewed: 12/05/2019 Elsevier Patient Education  [place].

## 2022-02-02 ENCOUNTER — Encounter: Payer: Medicare HMO | Admitting: *Deleted

## 2022-02-02 ENCOUNTER — Telehealth: Payer: Self-pay | Admitting: *Deleted

## 2022-02-02 NOTE — Patient Outreach (Signed)
  Care Coordination   Initial Visit Note   02/02/2022 Name: Robin Ochoa MRN: [id number] DOB: 05/09/1955  Robin Ochoa is a 66 y.o. year old female who sees Gale Journey, Wonda Cheng, FNP for primary care. I spoke with  Robin Goad Labrum by phone today.  What matters to the patients health and wellness today?  PLEASE CALL BACK TOMORROW.    Goals Addressed   None     SDOH assessments and interventions completed:  No     Care Coordination Interventions Activated:  No  Care Coordination Interventions:  No, not indicated   Follow up plan: Follow up call scheduled for TOMORROW.    Encounter Outcome:  Pt. Request to Call Back   .CCS

## 2022-02-03 ENCOUNTER — Ambulatory Visit: Payer: Self-pay | Admitting: *Deleted

## 2022-02-03 ENCOUNTER — Telehealth: Payer: Self-pay | Admitting: *Deleted

## 2022-02-06 ENCOUNTER — Telehealth: Payer: Self-pay | Admitting: *Deleted

## 2022-02-06 ENCOUNTER — Encounter: Payer: Self-pay | Admitting: *Deleted

## 2022-02-06 ENCOUNTER — Ambulatory Visit: Payer: Self-pay | Admitting: *Deleted

## 2022-02-06 NOTE — Patient Outreach (Signed)
  Care Coordination   Initial Visit Note   02/06/2022 Name: Robin Ochoa MRN: [id number] DOB: 02-08-1956  Robin Ochoa is a 66 y.o. year old female who sees Gale Journey, Wonda Cheng, FNP for primary care. I spoke with  Robin Ochoa by phone today.  What matters to the patients health and wellness today?  Continuing to improve in general post back surgery.    Goals Addressed             This Visit's Progress    I will be safety consciouus to avoid falls and other household accidents over the next 3 months.   On track    Care Coordination Interventions: Falls assessment completed: one fall this year, no injury. Pt is somewhat unsteady and her mobility is somewhat compromised due to back surgery. Reviewed safety measures to prevent falls. Encouraged use of her 1 point cane.      I will review counting carbs educational materials and discuss with NP next month.   Not on track    Care Coordination Interventions: Reviewed pt current wt 185#. She is 5'3". Has lost 20# in last 3 months after back surgery. Prediabetic. No currently dieting. Unfamiliar with carb counting. Would like to lose additional wt. Current BMI is 32.77 (Obese). Ideal body weight 141-152 for senior female. Introduced carb counting. Elicited pt interest  Sending educational information.         SDOH assessments and interventions completed:  Yes No needs for food, housing, violence, transportation, utilities or social.   Care Coordination Interventions Activated:  Yes  Care Coordination Interventions:  Yes, provided   Follow up plan: Follow up call scheduled for 1 month.    Encounter Outcome:  Pt. Visit Completed   Kayleen Memos C. Myrtie Neither, MSN, [facility] Gerontological Nurse Practitioner [facility] Care Management [phone]

## 2022-02-18 ENCOUNTER — Ambulatory Visit: Payer: Medicare HMO | Attending: Cardiovascular Disease

## 2022-02-18 ENCOUNTER — Ambulatory Visit
Admission: RE | Admit: 2022-02-18 | Discharge: 2022-02-18 | Disposition: A | Discharge status: Home / Self Care | Payer: Medicare HMO | Source: Ambulatory Visit | Attending: Cardiovascular Disease | Admitting: Cardiovascular Disease

## 2022-02-18 DIAGNOSIS — I059 Rheumatic mitral valve disease, unspecified: Secondary | ICD-10-CM | POA: Diagnosis not present

## 2022-02-18 DIAGNOSIS — E782 Mixed hyperlipidemia: Secondary | ICD-10-CM | POA: Insufficient documentation

## 2022-02-18 DIAGNOSIS — R0602 Shortness of breath: Secondary | ICD-10-CM

## 2022-02-18 LAB — ECHOCARDIOGRAM COMPLETE
AR max vel: 3.63 cm2
AV Area VTI: 3.45 cm2
AV Area mean vel: 3.28 cm2
AV Mean grad: 3 mmHg
AV Peak grad: 5.7 mmHg
Ao pk vel: 1.19 m/s
Area-P 1/2: 3.43 cm2
Calc EF: 54.2 %
S' Lateral: 3.4 cm
Single Plane A2C EF: 55.3 %
Single Plane A4C EF: 54.4 %

## 2022-02-27 DIAGNOSIS — M5416 Radiculopathy, lumbar region: Secondary | ICD-10-CM | POA: Diagnosis not present

## 2022-02-27 DIAGNOSIS — G8929 Other chronic pain: Secondary | ICD-10-CM | POA: Diagnosis not present

## 2022-02-27 DIAGNOSIS — M47816 Spondylosis without myelopathy or radiculopathy, lumbar region: Secondary | ICD-10-CM | POA: Diagnosis not present

## 2022-02-27 DIAGNOSIS — M25512 Pain in left shoulder: Secondary | ICD-10-CM | POA: Diagnosis not present

## 2022-03-11 ENCOUNTER — Encounter: Payer: Self-pay | Admitting: *Deleted

## 2022-03-11 ENCOUNTER — Telehealth: Payer: Self-pay | Admitting: *Deleted

## 2022-03-12 NOTE — Patient Outreach (Signed)
  Care Coordination   03/12/2022 Name: Robin Ochoa MRN: [id number] DOB: [birth year]   Care Coordination Outreach Attempts:  A second unsuccessful outreach was attempted today to offer the patient with information about available care coordination services as a benefit of their health plan.     Follow Up Plan:  Additional outreach attempts will be made to offer the patient care coordination information and services.   Encounter Outcome:  No Answer  Care Coordination Interventions Activated:  No   Care Coordination Interventions:  No, not indicated    Espen Bethel C. Myrtie Neither, MSN, [facility] Gerontological Nurse Practitioner [facility] Care Management [phone]

## 2022-03-13 DIAGNOSIS — M25512 Pain in left shoulder: Secondary | ICD-10-CM | POA: Diagnosis not present

## 2022-03-13 DIAGNOSIS — J329 Chronic sinusitis, unspecified: Secondary | ICD-10-CM | POA: Diagnosis not present

## 2022-03-13 DIAGNOSIS — J40 Bronchitis, not specified as acute or chronic: Secondary | ICD-10-CM | POA: Diagnosis not present

## 2022-03-13 DIAGNOSIS — R748 Abnormal levels of other serum enzymes: Secondary | ICD-10-CM | POA: Diagnosis not present

## 2022-03-13 DIAGNOSIS — G8929 Other chronic pain: Secondary | ICD-10-CM | POA: Diagnosis not present

## 2022-03-13 DIAGNOSIS — R7989 Other specified abnormal findings of blood chemistry: Secondary | ICD-10-CM | POA: Diagnosis not present

## 2022-03-17 ENCOUNTER — Telehealth: Payer: Self-pay | Admitting: *Deleted

## 2022-03-17 DIAGNOSIS — G8929 Other chronic pain: Secondary | ICD-10-CM | POA: Diagnosis not present

## 2022-03-17 DIAGNOSIS — F411 Generalized anxiety disorder: Secondary | ICD-10-CM | POA: Diagnosis not present

## 2022-03-17 DIAGNOSIS — F331 Major depressive disorder, recurrent, moderate: Secondary | ICD-10-CM | POA: Diagnosis not present

## 2022-03-17 DIAGNOSIS — R69 Illness, unspecified: Secondary | ICD-10-CM | POA: Diagnosis not present

## 2022-03-17 NOTE — Progress Notes (Signed)
  Care Coordination Note  03/17/2022 Name: Robin Ochoa MRN: [id number] DOB: May 27, 1955  Robin Ochoa is a 66 y.o. year old female who is a primary care patient of Romualdo Bolk, FNP and is actively engaged with the care management team. I reached out to Robin Ochoa by phone today to assist with re-scheduling a follow up visit with the RN Case Manager  Follow up plan: Unsuccessful telephone outreach attempt made. A HIPAA compliant phone message was left for the patient providing contact information and requesting a return call.   [place]  Direct Dial: [phone]

## 2022-03-18 ENCOUNTER — Encounter: Payer: Self-pay | Admitting: *Deleted

## 2022-03-23 DIAGNOSIS — J40 Bronchitis, not specified as acute or chronic: Secondary | ICD-10-CM | POA: Diagnosis not present

## 2022-03-23 DIAGNOSIS — M19012 Primary osteoarthritis, left shoulder: Secondary | ICD-10-CM | POA: Diagnosis not present

## 2022-03-23 DIAGNOSIS — J329 Chronic sinusitis, unspecified: Secondary | ICD-10-CM | POA: Diagnosis not present

## 2022-03-23 DIAGNOSIS — M25512 Pain in left shoulder: Secondary | ICD-10-CM | POA: Diagnosis not present

## 2022-03-23 DIAGNOSIS — R059 Cough, unspecified: Secondary | ICD-10-CM | POA: Diagnosis not present

## 2022-03-23 DIAGNOSIS — G8929 Other chronic pain: Secondary | ICD-10-CM | POA: Diagnosis not present

## 2022-03-24 NOTE — Progress Notes (Signed)
  Care Coordination Note  03/24/2022 Name: [name] MRN: [id number] DOB: 1955/06/28  Robin Ochoa is a 66 y.o. year old female who is a primary care patient of Romualdo Bolk, FNP and is actively engaged with the care management team. I reached out to Robin Ochoa by phone today to assist with re-scheduling a follow up visit with the RN Case Manager  Follow up plan: Unsuccessful telephone outreach attempt made. A HIPAA compliant phone message was left for the patient providing contact information and requesting a return call.   [place]  Direct Dial: [phone]

## 2022-03-25 DIAGNOSIS — D72829 Elevated white blood cell count, unspecified: Secondary | ICD-10-CM | POA: Diagnosis not present

## 2022-03-25 DIAGNOSIS — R7989 Other specified abnormal findings of blood chemistry: Secondary | ICD-10-CM | POA: Diagnosis not present

## 2022-03-25 DIAGNOSIS — Z712 Person consulting for explanation of examination or test findings: Secondary | ICD-10-CM | POA: Diagnosis not present

## 2022-03-30 ENCOUNTER — Encounter: Payer: Self-pay | Admitting: *Deleted

## 2022-03-30 NOTE — Progress Notes (Signed)
  Care Coordination Note  03/30/2022 Name: Robin Ochoa MRN: [id number] DOB: 1955-06-27  Robin Ochoa is a 66 y.o. year old female who is a primary care patient of Romualdo Bolk, FNP and is actively engaged with the care management team. I reached out to Robin Ochoa by phone today to assist with re-scheduling a follow up visit with the RN Case Manager  Follow up plan: Unsuccessful telephone outreach attempt made. A HIPAA compliant phone message was left for the patient providing contact information and requesting a return call.  We have been unable to make contact with the patient for follow up. The care management team is available to follow up with the patient after provider conversation with the patient regarding recommendation for care management engagement and subsequent re-referral to the care management team.   [place]  Direct Dial: [phone]

## 2022-03-30 NOTE — Patient Outreach (Signed)
Pt case closed due to inability to contact her.  Eulah Pont. Myrtie Neither, MSN, [facility] Gerontological Nurse Practitioner [facility] Care Management [phone]

## 2022-06-12 DIAGNOSIS — M961 Postlaminectomy syndrome, not elsewhere classified: Secondary | ICD-10-CM | POA: Diagnosis not present

## 2022-06-12 DIAGNOSIS — M19012 Primary osteoarthritis, left shoulder: Secondary | ICD-10-CM | POA: Diagnosis not present

## 2022-06-12 DIAGNOSIS — R2689 Other abnormalities of gait and mobility: Secondary | ICD-10-CM | POA: Diagnosis not present

## 2022-06-12 DIAGNOSIS — R69 Illness, unspecified: Secondary | ICD-10-CM | POA: Diagnosis not present

## 2022-06-12 DIAGNOSIS — F1190 Opioid use, unspecified, uncomplicated: Secondary | ICD-10-CM | POA: Diagnosis not present

## 2022-06-12 DIAGNOSIS — M47816 Spondylosis without myelopathy or radiculopathy, lumbar region: Secondary | ICD-10-CM | POA: Diagnosis not present

## 2022-06-12 DIAGNOSIS — G894 Chronic pain syndrome: Secondary | ICD-10-CM | POA: Diagnosis not present

## 2022-06-12 DIAGNOSIS — Z79891 Long term (current) use of opiate analgesic: Secondary | ICD-10-CM | POA: Diagnosis not present

## 2022-07-31 DIAGNOSIS — G8929 Other chronic pain: Secondary | ICD-10-CM | POA: Diagnosis not present

## 2022-07-31 DIAGNOSIS — G894 Chronic pain syndrome: Secondary | ICD-10-CM | POA: Diagnosis not present

## 2022-07-31 DIAGNOSIS — M19012 Primary osteoarthritis, left shoulder: Secondary | ICD-10-CM | POA: Diagnosis not present

## 2022-09-11 DIAGNOSIS — M5416 Radiculopathy, lumbar region: Secondary | ICD-10-CM | POA: Diagnosis not present

## 2022-09-11 DIAGNOSIS — M47816 Spondylosis without myelopathy or radiculopathy, lumbar region: Secondary | ICD-10-CM | POA: Diagnosis not present

## 2022-09-11 DIAGNOSIS — Z79891 Long term (current) use of opiate analgesic: Secondary | ICD-10-CM | POA: Diagnosis not present

## 2022-09-11 DIAGNOSIS — Z5181 Encounter for therapeutic drug level monitoring: Secondary | ICD-10-CM | POA: Diagnosis not present

## 2022-09-28 DIAGNOSIS — F411 Generalized anxiety disorder: Secondary | ICD-10-CM | POA: Diagnosis not present

## 2022-09-28 DIAGNOSIS — G8929 Other chronic pain: Secondary | ICD-10-CM | POA: Diagnosis not present

## 2022-09-28 DIAGNOSIS — F331 Major depressive disorder, recurrent, moderate: Secondary | ICD-10-CM | POA: Diagnosis not present

## 2022-10-18 DIAGNOSIS — G4733 Obstructive sleep apnea (adult) (pediatric): Secondary | ICD-10-CM | POA: Diagnosis not present

## 2022-10-18 DIAGNOSIS — M79631 Pain in right forearm: Secondary | ICD-10-CM | POA: Diagnosis not present

## 2022-10-18 DIAGNOSIS — Z7982 Long term (current) use of aspirin: Secondary | ICD-10-CM | POA: Diagnosis not present

## 2022-10-18 DIAGNOSIS — Z886 Allergy status to analgesic agent status: Secondary | ICD-10-CM | POA: Diagnosis not present

## 2022-10-18 DIAGNOSIS — W108XXA Fall (on) (from) other stairs and steps, initial encounter: Secondary | ICD-10-CM | POA: Diagnosis not present

## 2022-10-18 DIAGNOSIS — Z79891 Long term (current) use of opiate analgesic: Secondary | ICD-10-CM | POA: Diagnosis not present

## 2022-10-18 DIAGNOSIS — M25531 Pain in right wrist: Secondary | ICD-10-CM | POA: Diagnosis not present

## 2022-10-18 DIAGNOSIS — S52571A Other intraarticular fracture of lower end of right radius, initial encounter for closed fracture: Secondary | ICD-10-CM | POA: Diagnosis not present

## 2022-10-18 DIAGNOSIS — M79641 Pain in right hand: Secondary | ICD-10-CM | POA: Diagnosis not present

## 2022-10-18 DIAGNOSIS — M25431 Effusion, right wrist: Secondary | ICD-10-CM | POA: Diagnosis not present

## 2022-10-18 DIAGNOSIS — S6991XA Unspecified injury of right wrist, hand and finger(s), initial encounter: Secondary | ICD-10-CM | POA: Diagnosis not present

## 2022-10-18 DIAGNOSIS — Z79899 Other long term (current) drug therapy: Secondary | ICD-10-CM | POA: Diagnosis not present

## 2022-10-18 DIAGNOSIS — M1990 Unspecified osteoarthritis, unspecified site: Secondary | ICD-10-CM | POA: Diagnosis not present

## 2022-10-18 DIAGNOSIS — E782 Mixed hyperlipidemia: Secondary | ICD-10-CM | POA: Diagnosis not present

## 2022-10-23 DIAGNOSIS — M1990 Unspecified osteoarthritis, unspecified site: Secondary | ICD-10-CM | POA: Diagnosis not present

## 2022-10-23 DIAGNOSIS — Z880 Allergy status to penicillin: Secondary | ICD-10-CM | POA: Diagnosis not present

## 2022-10-23 DIAGNOSIS — S52571A Other intraarticular fracture of lower end of right radius, initial encounter for closed fracture: Secondary | ICD-10-CM | POA: Diagnosis not present

## 2022-10-23 DIAGNOSIS — W19XXXA Unspecified fall, initial encounter: Secondary | ICD-10-CM | POA: Diagnosis not present

## 2022-10-23 DIAGNOSIS — Z7982 Long term (current) use of aspirin: Secondary | ICD-10-CM | POA: Diagnosis not present

## 2022-10-23 DIAGNOSIS — Z79899 Other long term (current) drug therapy: Secondary | ICD-10-CM | POA: Diagnosis not present

## 2022-10-23 DIAGNOSIS — E785 Hyperlipidemia, unspecified: Secondary | ICD-10-CM | POA: Diagnosis not present

## 2022-10-23 DIAGNOSIS — G8918 Other acute postprocedural pain: Secondary | ICD-10-CM | POA: Diagnosis not present

## 2022-10-23 DIAGNOSIS — S52501A Unspecified fracture of the lower end of right radius, initial encounter for closed fracture: Secondary | ICD-10-CM | POA: Diagnosis not present

## 2022-11-02 DIAGNOSIS — S52561A Barton's fracture of right radius, initial encounter for closed fracture: Secondary | ICD-10-CM | POA: Diagnosis not present

## 2022-11-02 DIAGNOSIS — R29898 Other symptoms and signs involving the musculoskeletal system: Secondary | ICD-10-CM | POA: Diagnosis not present

## 2022-11-02 DIAGNOSIS — M25631 Stiffness of right wrist, not elsewhere classified: Secondary | ICD-10-CM | POA: Diagnosis not present

## 2022-11-02 DIAGNOSIS — X58XXXA Exposure to other specified factors, initial encounter: Secondary | ICD-10-CM | POA: Diagnosis not present

## 2022-11-23 DIAGNOSIS — R29898 Other symptoms and signs involving the musculoskeletal system: Secondary | ICD-10-CM | POA: Diagnosis not present

## 2022-11-23 DIAGNOSIS — S52561A Barton's fracture of right radius, initial encounter for closed fracture: Secondary | ICD-10-CM | POA: Diagnosis not present

## 2022-11-23 DIAGNOSIS — M25631 Stiffness of right wrist, not elsewhere classified: Secondary | ICD-10-CM | POA: Diagnosis not present

## 2022-11-23 DIAGNOSIS — X58XXXA Exposure to other specified factors, initial encounter: Secondary | ICD-10-CM | POA: Diagnosis not present

## 2022-11-30 DIAGNOSIS — R29898 Other symptoms and signs involving the musculoskeletal system: Secondary | ICD-10-CM | POA: Diagnosis not present

## 2022-11-30 DIAGNOSIS — X58XXXA Exposure to other specified factors, initial encounter: Secondary | ICD-10-CM | POA: Diagnosis not present

## 2022-11-30 DIAGNOSIS — Z8781 Personal history of (healed) traumatic fracture: Secondary | ICD-10-CM | POA: Diagnosis not present

## 2022-11-30 DIAGNOSIS — M25631 Stiffness of right wrist, not elsewhere classified: Secondary | ICD-10-CM | POA: Diagnosis not present

## 2022-11-30 DIAGNOSIS — S52501D Unspecified fracture of the lower end of right radius, subsequent encounter for closed fracture with routine healing: Secondary | ICD-10-CM | POA: Diagnosis not present

## 2022-11-30 DIAGNOSIS — S52561A Barton's fracture of right radius, initial encounter for closed fracture: Secondary | ICD-10-CM | POA: Diagnosis not present

## 2022-11-30 DIAGNOSIS — Z9889 Other specified postprocedural states: Secondary | ICD-10-CM | POA: Diagnosis not present

## 2022-12-09 DIAGNOSIS — G894 Chronic pain syndrome: Secondary | ICD-10-CM | POA: Diagnosis not present

## 2022-12-09 DIAGNOSIS — M5416 Radiculopathy, lumbar region: Secondary | ICD-10-CM | POA: Diagnosis not present

## 2022-12-09 DIAGNOSIS — M19012 Primary osteoarthritis, left shoulder: Secondary | ICD-10-CM | POA: Diagnosis not present

## 2022-12-09 DIAGNOSIS — Z5181 Encounter for therapeutic drug level monitoring: Secondary | ICD-10-CM | POA: Diagnosis not present

## 2022-12-09 DIAGNOSIS — G8929 Other chronic pain: Secondary | ICD-10-CM | POA: Diagnosis not present

## 2022-12-09 DIAGNOSIS — F1190 Opioid use, unspecified, uncomplicated: Secondary | ICD-10-CM | POA: Diagnosis not present

## 2022-12-09 DIAGNOSIS — Z79891 Long term (current) use of opiate analgesic: Secondary | ICD-10-CM | POA: Diagnosis not present

## 2022-12-09 DIAGNOSIS — M25512 Pain in left shoulder: Secondary | ICD-10-CM | POA: Diagnosis not present

## 2022-12-09 DIAGNOSIS — M961 Postlaminectomy syndrome, not elsewhere classified: Secondary | ICD-10-CM | POA: Diagnosis not present

## 2023-01-04 DIAGNOSIS — S52501D Unspecified fracture of the lower end of right radius, subsequent encounter for closed fracture with routine healing: Secondary | ICD-10-CM | POA: Diagnosis not present

## 2023-01-04 DIAGNOSIS — Z9889 Other specified postprocedural states: Secondary | ICD-10-CM | POA: Diagnosis not present

## 2023-01-04 DIAGNOSIS — Z8781 Personal history of (healed) traumatic fracture: Secondary | ICD-10-CM | POA: Diagnosis not present

## 2023-01-04 DIAGNOSIS — X58XXXD Exposure to other specified factors, subsequent encounter: Secondary | ICD-10-CM | POA: Diagnosis not present

## 2023-01-04 DIAGNOSIS — S52351D Displaced comminuted fracture of shaft of radius, right arm, subsequent encounter for closed fracture with routine healing: Secondary | ICD-10-CM | POA: Diagnosis not present

## 2023-01-26 DIAGNOSIS — M8580 Other specified disorders of bone density and structure, unspecified site: Secondary | ICD-10-CM | POA: Diagnosis not present

## 2023-01-26 DIAGNOSIS — M810 Age-related osteoporosis without current pathological fracture: Secondary | ICD-10-CM | POA: Diagnosis not present

## 2023-01-26 DIAGNOSIS — Z780 Asymptomatic menopausal state: Secondary | ICD-10-CM | POA: Diagnosis not present

## 2023-01-26 LAB — HM DEXA SCAN

## 2023-02-10 DIAGNOSIS — E782 Mixed hyperlipidemia: Secondary | ICD-10-CM | POA: Diagnosis not present

## 2023-02-10 DIAGNOSIS — Z1231 Encounter for screening mammogram for malignant neoplasm of breast: Secondary | ICD-10-CM | POA: Diagnosis not present

## 2023-02-10 DIAGNOSIS — K219 Gastro-esophageal reflux disease without esophagitis: Secondary | ICD-10-CM | POA: Diagnosis not present

## 2023-02-10 DIAGNOSIS — Z0000 Encounter for general adult medical examination without abnormal findings: Secondary | ICD-10-CM | POA: Diagnosis not present

## 2023-02-10 DIAGNOSIS — M810 Age-related osteoporosis without current pathological fracture: Secondary | ICD-10-CM | POA: Diagnosis not present

## 2023-02-10 DIAGNOSIS — Z23 Encounter for immunization: Secondary | ICD-10-CM | POA: Diagnosis not present

## 2023-02-10 DIAGNOSIS — M7989 Other specified soft tissue disorders: Secondary | ICD-10-CM | POA: Diagnosis not present

## 2023-02-10 DIAGNOSIS — Z131 Encounter for screening for diabetes mellitus: Secondary | ICD-10-CM | POA: Diagnosis not present

## 2023-02-10 DIAGNOSIS — Z0001 Encounter for general adult medical examination with abnormal findings: Secondary | ICD-10-CM | POA: Diagnosis not present

## 2023-03-11 DIAGNOSIS — M47816 Spondylosis without myelopathy or radiculopathy, lumbar region: Secondary | ICD-10-CM | POA: Diagnosis not present

## 2023-03-11 DIAGNOSIS — Z79891 Long term (current) use of opiate analgesic: Secondary | ICD-10-CM | POA: Diagnosis not present

## 2023-03-11 DIAGNOSIS — M542 Cervicalgia: Secondary | ICD-10-CM | POA: Diagnosis not present

## 2023-03-11 DIAGNOSIS — R0781 Pleurodynia: Secondary | ICD-10-CM | POA: Diagnosis not present

## 2023-03-11 DIAGNOSIS — Z5181 Encounter for therapeutic drug level monitoring: Secondary | ICD-10-CM | POA: Diagnosis not present

## 2023-03-11 DIAGNOSIS — M5416 Radiculopathy, lumbar region: Secondary | ICD-10-CM | POA: Diagnosis not present

## 2023-04-01 DIAGNOSIS — G8929 Other chronic pain: Secondary | ICD-10-CM | POA: Diagnosis not present

## 2023-04-01 DIAGNOSIS — M19012 Primary osteoarthritis, left shoulder: Secondary | ICD-10-CM | POA: Diagnosis not present

## 2023-04-13 DIAGNOSIS — M810 Age-related osteoporosis without current pathological fracture: Secondary | ICD-10-CM | POA: Diagnosis not present

## 2023-04-22 DIAGNOSIS — F411 Generalized anxiety disorder: Secondary | ICD-10-CM | POA: Diagnosis not present

## 2023-04-22 DIAGNOSIS — F331 Major depressive disorder, recurrent, moderate: Secondary | ICD-10-CM | POA: Diagnosis not present

## 2023-04-22 DIAGNOSIS — G8929 Other chronic pain: Secondary | ICD-10-CM | POA: Diagnosis not present

## 2023-06-11 DIAGNOSIS — M4726 Other spondylosis with radiculopathy, lumbar region: Secondary | ICD-10-CM | POA: Diagnosis not present

## 2023-06-11 DIAGNOSIS — M47816 Spondylosis without myelopathy or radiculopathy, lumbar region: Secondary | ICD-10-CM | POA: Diagnosis not present

## 2023-06-11 DIAGNOSIS — M961 Postlaminectomy syndrome, not elsewhere classified: Secondary | ICD-10-CM | POA: Diagnosis not present

## 2023-06-11 DIAGNOSIS — G894 Chronic pain syndrome: Secondary | ICD-10-CM | POA: Diagnosis not present

## 2023-06-11 DIAGNOSIS — Z5181 Encounter for therapeutic drug level monitoring: Secondary | ICD-10-CM | POA: Diagnosis not present

## 2023-06-11 DIAGNOSIS — M19012 Primary osteoarthritis, left shoulder: Secondary | ICD-10-CM | POA: Diagnosis not present

## 2023-06-11 DIAGNOSIS — F1190 Opioid use, unspecified, uncomplicated: Secondary | ICD-10-CM | POA: Diagnosis not present

## 2023-06-11 DIAGNOSIS — Z79891 Long term (current) use of opiate analgesic: Secondary | ICD-10-CM | POA: Diagnosis not present

## 2023-06-11 DIAGNOSIS — M5416 Radiculopathy, lumbar region: Secondary | ICD-10-CM | POA: Diagnosis not present

## 2023-06-22 DIAGNOSIS — H2513 Age-related nuclear cataract, bilateral: Secondary | ICD-10-CM | POA: Diagnosis not present

## 2023-06-23 DIAGNOSIS — Z0000 Encounter for general adult medical examination without abnormal findings: Secondary | ICD-10-CM | POA: Diagnosis not present

## 2023-06-23 DIAGNOSIS — Z1231 Encounter for screening mammogram for malignant neoplasm of breast: Secondary | ICD-10-CM | POA: Diagnosis not present

## 2023-06-23 LAB — HM MAMMOGRAPHY

## 2023-07-14 DIAGNOSIS — L920 Granuloma annulare: Secondary | ICD-10-CM | POA: Diagnosis not present

## 2023-09-02 ENCOUNTER — Emergency Department

## 2023-09-02 ENCOUNTER — Observation Stay
Admission: EM | Admit: 2023-09-02 | Discharge: 2023-09-04 | Disposition: A | Discharge status: Home / Self Care | Attending: Internal Medicine | Admitting: Internal Medicine

## 2023-09-02 ENCOUNTER — Other Ambulatory Visit: Payer: Self-pay

## 2023-09-02 DIAGNOSIS — R112 Nausea with vomiting, unspecified: Secondary | ICD-10-CM | POA: Diagnosis not present

## 2023-09-02 DIAGNOSIS — M5442 Lumbago with sciatica, left side: Secondary | ICD-10-CM | POA: Insufficient documentation

## 2023-09-02 DIAGNOSIS — R7989 Other specified abnormal findings of blood chemistry: Secondary | ICD-10-CM | POA: Diagnosis present

## 2023-09-02 DIAGNOSIS — Z7982 Long term (current) use of aspirin: Secondary | ICD-10-CM | POA: Insufficient documentation

## 2023-09-02 DIAGNOSIS — E872 Acidosis, unspecified: Secondary | ICD-10-CM | POA: Diagnosis present

## 2023-09-02 DIAGNOSIS — J069 Acute upper respiratory infection, unspecified: Secondary | ICD-10-CM | POA: Diagnosis not present

## 2023-09-02 DIAGNOSIS — Z1152 Encounter for screening for COVID-19: Secondary | ICD-10-CM | POA: Insufficient documentation

## 2023-09-02 DIAGNOSIS — I219 Acute myocardial infarction, unspecified: Secondary | ICD-10-CM | POA: Diagnosis not present

## 2023-09-02 DIAGNOSIS — A419 Sepsis, unspecified organism: Principal | ICD-10-CM

## 2023-09-02 DIAGNOSIS — J988 Other specified respiratory disorders: Secondary | ICD-10-CM | POA: Diagnosis present

## 2023-09-02 DIAGNOSIS — R6883 Chills (without fever): Secondary | ICD-10-CM | POA: Diagnosis not present

## 2023-09-02 DIAGNOSIS — F32A Depression, unspecified: Secondary | ICD-10-CM | POA: Diagnosis not present

## 2023-09-02 DIAGNOSIS — T699XXA Effect of reduced temperature, unspecified, initial encounter: Secondary | ICD-10-CM | POA: Diagnosis not present

## 2023-09-02 DIAGNOSIS — J209 Acute bronchitis, unspecified: Secondary | ICD-10-CM | POA: Insufficient documentation

## 2023-09-02 DIAGNOSIS — R0902 Hypoxemia: Secondary | ICD-10-CM | POA: Diagnosis not present

## 2023-09-02 DIAGNOSIS — E669 Obesity, unspecified: Secondary | ICD-10-CM | POA: Diagnosis not present

## 2023-09-02 DIAGNOSIS — K2950 Unspecified chronic gastritis without bleeding: Secondary | ICD-10-CM | POA: Diagnosis not present

## 2023-09-02 DIAGNOSIS — G8929 Other chronic pain: Secondary | ICD-10-CM | POA: Diagnosis present

## 2023-09-02 DIAGNOSIS — R000 Tachycardia, unspecified: Secondary | ICD-10-CM | POA: Diagnosis not present

## 2023-09-02 DIAGNOSIS — J40 Bronchitis, not specified as acute or chronic: Secondary | ICD-10-CM | POA: Diagnosis not present

## 2023-09-02 DIAGNOSIS — R1111 Vomiting without nausea: Secondary | ICD-10-CM | POA: Diagnosis not present

## 2023-09-02 DIAGNOSIS — M5441 Lumbago with sciatica, right side: Secondary | ICD-10-CM | POA: Insufficient documentation

## 2023-09-02 DIAGNOSIS — R6510 Systemic inflammatory response syndrome (SIRS) of non-infectious origin without acute organ dysfunction: Secondary | ICD-10-CM | POA: Diagnosis present

## 2023-09-02 DIAGNOSIS — E785 Hyperlipidemia, unspecified: Secondary | ICD-10-CM | POA: Diagnosis not present

## 2023-09-02 DIAGNOSIS — R519 Headache, unspecified: Secondary | ICD-10-CM | POA: Diagnosis not present

## 2023-09-02 DIAGNOSIS — J9601 Acute respiratory failure with hypoxia: Secondary | ICD-10-CM | POA: Insufficient documentation

## 2023-09-02 HISTORY — DX: Dyspnea, unspecified: R06.00

## 2023-09-02 HISTORY — DX: Other ill-defined heart diseases: I51.89

## 2023-09-02 HISTORY — DX: Essential (primary) hypertension: I10

## 2023-09-02 HISTORY — DX: Depression, unspecified: F32.A

## 2023-09-02 LAB — URINALYSIS, W/ REFLEX TO CULTURE (INFECTION SUSPECTED)
Bacteria, UA: NONE SEEN
Bilirubin Urine: NEGATIVE
Glucose, UA: NEGATIVE mg/dL
Ketones, ur: NEGATIVE mg/dL
Nitrite: NEGATIVE
Protein, ur: NEGATIVE mg/dL
Specific Gravity, Urine: 1.012 (ref 1.005–1.030)
pH: 5 (ref 5.0–8.0)

## 2023-09-02 LAB — TROPONIN I (HIGH SENSITIVITY)
Troponin I (High Sensitivity): 53 ng/L — ABNORMAL HIGH (ref <18)
Troponin I (High Sensitivity): 188 ng/L (ref <18)

## 2023-09-02 LAB — COMPREHENSIVE METABOLIC PANEL WITH GFR
ALT: 18 U/L (ref 0–44)
AST: 24 U/L (ref 15–41)
Albumin: 3.3 g/dL — ABNORMAL LOW (ref 3.5–5.0)
Alkaline Phosphatase: 85 U/L (ref 38–126)
Anion gap: 9 (ref 5–15)
BUN: 21 mg/dL (ref 8–23)
CO2: 22 mmol/L (ref 22–32)
Calcium: 8.8 mg/dL — ABNORMAL LOW (ref 8.9–10.3)
Chloride: 108 mmol/L (ref 98–111)
Creatinine, Ser: 1.07 mg/dL — ABNORMAL HIGH (ref 0.44–1.00)
GFR, Estimated: 56 mL/min — ABNORMAL LOW (ref >60)
Glucose, Bld: 173 mg/dL — ABNORMAL HIGH (ref 70–99)
Potassium: 3.7 mmol/L (ref 3.5–5.1)
Sodium: 139 mmol/L (ref 135–145)
Total Bilirubin: 0.4 mg/dL (ref 0.0–1.2)
Total Protein: 6.1 g/dL — ABNORMAL LOW (ref 6.5–8.1)

## 2023-09-02 LAB — CBC WITH DIFFERENTIAL/PLATELET
Abs Immature Granulocytes: 0.02 10*3/uL (ref 0.00–0.07)
Basophils Absolute: 0 10*3/uL (ref 0.0–0.1)
Basophils Relative: 0 %
Eosinophils Absolute: 0.1 10*3/uL (ref 0.0–0.5)
Eosinophils Relative: 1 %
HCT: 37.2 % (ref 36.0–46.0)
Hemoglobin: 12.5 g/dL (ref 12.0–15.0)
Immature Granulocytes: 0 %
Lymphocytes Relative: 6 %
Lymphs Abs: 0.4 10*3/uL — ABNORMAL LOW (ref 0.7–4.0)
MCH: 32.5 pg (ref 26.0–34.0)
MCHC: 33.6 g/dL (ref 30.0–36.0)
MCV: 96.6 fL (ref 80.0–100.0)
Monocytes Absolute: 0.1 10*3/uL (ref 0.1–1.0)
Monocytes Relative: 1 %
Neutro Abs: 6 10*3/uL (ref 1.7–7.7)
Neutrophils Relative %: 92 %
Platelets: 209 10*3/uL (ref 150–400)
RBC: 3.85 MIL/uL — ABNORMAL LOW (ref 3.87–5.11)
RDW: 12.6 % (ref 11.5–15.5)
WBC: 6.5 10*3/uL (ref 4.0–10.5)
nRBC: 0 % (ref 0.0–0.2)

## 2023-09-02 LAB — RESP PANEL BY RT-PCR (RSV, FLU A&B, COVID)  RVPGX2
Influenza A by PCR: NEGATIVE
Influenza B by PCR: NEGATIVE
Resp Syncytial Virus by PCR: NEGATIVE
SARS Coronavirus 2 by RT PCR: NEGATIVE

## 2023-09-02 LAB — PROCALCITONIN: Procalcitonin: <0.1 ng/mL

## 2023-09-02 LAB — PROTIME-INR
INR: 1 (ref 0.8–1.2)
Prothrombin Time: 12.9 s (ref 11.4–15.2)

## 2023-09-02 LAB — LACTIC ACID, PLASMA
Lactic Acid, Venous: 2.2 mmol/L (ref 0.5–1.9)
Lactic Acid, Venous: 2.8 mmol/L (ref 0.5–1.9)

## 2023-09-02 LAB — BRAIN NATRIURETIC PEPTIDE: B Natriuretic Peptide: 25.6 pg/mL (ref 0.0–100.0)

## 2023-09-02 MED ORDER — METRONIDAZOLE 500 MG/100ML IV SOLN
500.0000 mg | Freq: Once | INTRAVENOUS | Status: AC
  Administered 2023-09-02: 500 mg via INTRAVENOUS
  Filled 2023-09-02: qty 100

## 2023-09-02 MED ORDER — LACTATED RINGERS IV BOLUS
500.0000 mL | Freq: Once | INTRAVENOUS | Status: AC
Start: 2023-09-02 — End: 2023-09-03
  Administered 2023-09-03: 500 mL via INTRAVENOUS

## 2023-09-02 MED ORDER — HEPARIN BOLUS VIA INFUSION
4000.0000 [IU] | Freq: Once | INTRAVENOUS | Status: AC
  Administered 2023-09-03: 4000 [IU] via INTRAVENOUS
  Filled 2023-09-02: qty 4000

## 2023-09-02 MED ORDER — VANCOMYCIN HCL IN DEXTROSE 1-5 GM/200ML-% IV SOLN: 1000.0000 mg | Freq: Once | INTRAVENOUS | Status: DC

## 2023-09-02 MED ORDER — ACETAMINOPHEN 500 MG PO TABS
1000.0000 mg | ORAL_TABLET | Freq: Once | ORAL | Status: AC
  Administered 2023-09-02: 1000 mg via ORAL
  Filled 2023-09-02: qty 2

## 2023-09-02 MED ORDER — LACTATED RINGERS IV BOLUS
1000.0000 mL | Freq: Once | INTRAVENOUS | Status: AC
  Administered 2023-09-02: 1000 mL via INTRAVENOUS

## 2023-09-02 MED ORDER — SODIUM CHLORIDE 0.9 % IV SOLN
2.0000 g | Freq: Once | INTRAVENOUS | Status: AC
  Administered 2023-09-02: 2 g via INTRAVENOUS
  Filled 2023-09-02: qty 12.5

## 2023-09-02 MED ORDER — VANCOMYCIN HCL 2000 MG/400ML IV SOLN
2000.0000 mg | Freq: Once | INTRAVENOUS | Status: AC
  Administered 2023-09-02: 2000 mg via INTRAVENOUS
  Filled 2023-09-02: qty 400

## 2023-09-02 MED ORDER — HEPARIN (PORCINE) 25000 UT/250ML-% IV SOLN
1300.0000 [IU]/h | INTRAVENOUS | Status: DC
  Administered 2023-09-03: 1000 [IU]/h via INTRAVENOUS
  Filled 2023-09-02: qty 250

## 2023-09-02 NOTE — Consult Note (Signed)
 CODE SEPSIS - PHARMACY COMMUNICATION  **Broad-spectrum antimicrobials should be administered within one hour of sepsis diagnosis**  Time Code Sepsis call or page was received: 2048  Antibiotics ordered: Cefepime, Vancomycin, Metronidazole  Time of first antibiotic administration: 2131  Additional action taken by pharmacy: N/A  If necessary, name of provider/nurse contacted: N/A    Will M. Alva Jewels, PharmD Clinical Pharmacist 09/02/2023 8:58 PM

## 2023-09-02 NOTE — ED Triage Notes (Signed)
 Pt BIB [facility] from home for chills, nausea, vomiting x 1.5 hours.  Pt endorses headache.  Temp 98.3 Oral HR 126 88% on RA, with 5L [place] 94% 22g in left hand  4mg  Zofran  IVP Pt A&O x 4  Pt has had sore throat, headache & raspy voice x 2 days.

## 2023-09-02 NOTE — Assessment & Plan Note (Addendum)
 Suspect demand ischemia from tachycardia in the 120s Troponin 53-->188--->416,  Denies chest pain and with nonacute EKG Coronary calcium score of 0 on CTA coronary 01/2022 Patient started on heparin  infusion by ED provider Will continue to trend troponin to peak Continue heparin  infusion for now though low suspicion for type I NSTEMI Echo to evaluate for wall motion abnormality Cardiology consult given upward trend of troponin

## 2023-09-02 NOTE — Assessment & Plan Note (Signed)
 History of lumbar laminectomy 2020 History of pelvic fracture 2014 Continue home Lyrica, Zanaflex and hydrocodone pending med reconciliation

## 2023-09-02 NOTE — Assessment & Plan Note (Addendum)
 History of chronic gastritis on EGD 2023 Uncertain etiology, possibly food related, possibly related to coughing from bronchitis, possible gastritis given recent flareup of acid reflux No abdominal pain or change in bowel habits and denies dysuria LFTs WNL, UA WNL.  Will get lipase-->22 IV antiemetics, IV Protonix Clear liquid diet

## 2023-09-02 NOTE — Sepsis Progress Note (Signed)
 Elink monitoring for the code sepsis protocol.

## 2023-09-02 NOTE — H&P (Addendum)
 History and Physical    Patient: Robin Ochoa [place] ZOX:[id number] DOB: February 15, 1956 DOA: 09/02/2023 DOS: the patient was seen and examined on 09/02/2023 PCP: Mai Schwalbe, FNP  Patient coming from: Home  Chief Complaint:  Chief Complaint  Patient presents with   Chills    HPI: Robin Ochoa is a 68 y.o. female with medical history significant for Pelvic fracture 2014, lumbar laminectomy 2020, HTN, depression, who presented by EMS with chills, nonbloody, nonbilious vomiting starting a couple hours prior to arrival.  She had a salad from a restaurant prior to the onset and wonders if she got food poisoning.  She denies abdominal pain or diarrhea but stated that a few days prior she had a bad case of acid reflux.  Additionally, she has 2-[place] history of congestion, nonproductive cough and hoarseness.  She denies fever. Denies chest pain.   Denies dysuria.  Denies back or neck pain.  With EMS she was tachycardic to 126 and normothermic with O2 sat 88% on room air requiring 5 L to get sats to the mid 90s. ED course and data review: On arrival temp of 101.3 and tachycardic to 124, tachypneic to 24, soft BP  103/52.  O2 sat on 4 L 97%. Notable labs include normal WBC but with lactic acid 2.4--> 2.8.  Procalcitonin <0.10. Troponin 53--> 188, BNP 25 Hemoglobin normal Creatinine 1.07 Glucose 173 Urinalysis with trace leukocytes Respiratory viral panel negative for COVID flu and RSV  EKG, personally reviewed and interpreted showing sinus at 96 Chest x-ray showing bronchitis/reactive airways  Patient treated with LR bolus and started on cefepime , metronidazole  and Flagyl  for sepsis of unknown source  Hospitalist consulted for admission.     Past Medical History:  Diagnosis Date   GERD (gastroesophageal reflux disease)    Migraine    Neck pain, chronic    Spinal stenosis    Past Surgical History:  Procedure Laterality Date   BACK SURGERY     COLONOSCOPY WITH PROPOFOL  N/A 10/07/2020   Procedure:  COLONOSCOPY WITH PROPOFOL ;  Surgeon: Luke Salaam, MD;  Location[facility] ENDOSCOPY;  Service: Gastroenterology;  Laterality: N/A;   COLONOSCOPY WITH PROPOFOL  N/A 11/14/2020   Procedure: COLONOSCOPY WITH PROPOFOL ;  Surgeon: Luke Salaam, MD;  Location: [facility] ENDOSCOPY;  Service: Gastroenterology;  Laterality: N/A;   COLONOSCOPY WITH PROPOFOL  N/A 11/13/2021   Procedure: COLONOSCOPY WITH PROPOFOL ;  Surgeon: Luke Salaam, MD;  Location: [facility] ENDOSCOPY;  Service: Gastroenterology;  Laterality: N/A;  3rd case   ESOPHAGOGASTRODUODENOSCOPY  11/13/2021   Procedure: ESOPHAGOGASTRODUODENOSCOPY (EGD);  Surgeon: Luke Salaam, MD;  Location: [facility] ENDOSCOPY;  Service: Gastroenterology;;   ESOPHAGOGASTRODUODENOSCOPY (EGD) WITH PROPOFOL  N/A 05/16/2021   Procedure: ESOPHAGOGASTRODUODENOSCOPY (EGD) WITH PROPOFOL ;  Surgeon: Luke Salaam, MD;  Location: [facility] ENDOSCOPY;  Service: Gastroenterology;  Laterality: N/A;   VAGINAL HYSTERECTOMY  68 years old   Tumors-    Social History:  reports that she has never smoked. She has never used smokeless tobacco. She reports that she does not currently use alcohol. She reports that she does not use drugs.  Allergies  Allergen Reactions   Ibuprofen Other (See Comments)    Causes heartburn, burning in her throat.   Penicillins Rash    Family History  Problem Relation Age of Onset   Heart failure Mother    Stomach cancer Father 70   Ovarian cancer Maternal Aunt    Ovarian cancer Maternal Aunt    Cancer Maternal Aunt        unk type; female origin  Lung cancer Paternal Uncle    Stomach cancer Paternal Grandmother        d. 26   Stomach cancer Other    Colon cancer Cousin        2 mat cousins; both d. 69s   Breast cancer Cousin        maternal cousin dx late 39s    Prior to Admission medications   Medication Sig Start Date End Date Taking? Authorizing Provider  aspirin EC 81 MG tablet Take 1 tablet by mouth daily.    [provider]  buPROPion (WELLBUTRIN XL) 150  MG 24 hr tablet Take 1 tablet by mouth daily as needed. 08/05/20   [provider]  hydrochlorothiazide  (HYDRODIURIL ) 25 MG tablet Take 1 tablet (25 mg total) by mouth daily as needed. 06/22/17   Gollan, Timothy J, MD  HYDROcodone-acetaminophen  (NORCO/VICODIN) 5-325 MG tablet Take by mouth. 03/15/18   [provider]  lidocaine  (LIDODERM ) 5 % Place onto the skin. 08/15/21   [provider]  naloxone [facility]) nasal spray 4 mg/0.1 mL Place into the nose. 11/26/21   [provider]  omeprazole (PRILOSEC) 20 MG capsule Take 20 mg by mouth 2 (two) times daily. 10/15/21   [provider]  pregabalin (LYRICA) 100 MG capsule Take 100 mg by mouth 2 (two) times daily. 04/10/21   [provider]  sertraline  (ZOLOFT ) 100 MG tablet Take by mouth. 04/22/21   [provider]  tiZANidine (ZANAFLEX) 4 MG tablet Take 4 mg by mouth every 6 (six) hours as needed for muscle spasms.    [provider]    Physical Exam: Vitals:   09/02/23 2230 09/02/23 2300 09/02/23 2330 09/02/23 2336  BP: (!) 107/52 (!) 103/52 101/68   Pulse: (!) 104 98 96   Resp: 20 (!) 22 (!) 25   Temp:    100.3 F (37.9 C)  TempSrc:    Oral  SpO2: 93% 91% 93%   Weight:      Height:       Physical Exam Vitals and nursing note reviewed.  Constitutional:      General: She is not in acute distress. HENT:     Head: Normocephalic and atraumatic.  Cardiovascular:     Rate and Rhythm: Regular rhythm. Tachycardia present.     Heart sounds: Normal heart sounds.  Pulmonary:     Effort: Tachypnea present.     Breath sounds: Normal breath sounds.  Abdominal:     Palpations: Abdomen is soft.     Tenderness: There is no abdominal tenderness.  Neurological:     Mental Status: Mental status is at baseline.     Labs on Admission: I have personally reviewed following labs and imaging studies  CBC: Recent Labs  Lab 09/02/23 2101  WBC 6.5  NEUTROABS 6.0  HGB 12.5  HCT  37.2  MCV 96.6  PLT 209   Basic Metabolic Panel: Recent Labs  Lab 09/02/23 2101  NA 139  K 3.7  CL 108  CO2 22  GLUCOSE 173*  BUN 21  CREATININE 1.07*  CALCIUM 8.8*   GFR: Estimated Creatinine Clearance: 50.5 mL/min (A) (by C-G formula based on SCr of 1.07 mg/dL (H)). Liver Function Tests: Recent Labs  Lab 09/02/23 2101  AST 24  ALT 18  ALKPHOS 85  BILITOT 0.4  PROT 6.1*  ALBUMIN 3.3*   No results for input(s): "LIPASE", "AMYLASE" in the last 168 hours. No results for input(s): "AMMONIA" in the last 168 hours.  Coagulation Profile: Recent Labs  Lab 09/02/23 2101  INR 1.0   Cardiac Enzymes: No results for input(s): "CKTOTAL", "CKMB", "CKMBINDEX", "TROPONINI" in the last 168 hours. BNP (last 3 results) No results for input(s): "PROBNP" in the last 8760 hours. HbA1C: No results for input(s): "HGBA1C" in the last 72 hours. CBG: No results for input(s): "GLUCAP" in the last 168 hours. Lipid Profile: No results for input(s): "CHOL", "HDL", "LDLCALC", "TRIG", "CHOLHDL", "LDLDIRECT" in the last 72 hours. Thyroid  Function Tests: No results for input(s): "TSH", "T4TOTAL", "FREET4", "T3FREE", "THYROIDAB" in the last 72 hours. Anemia Panel: No results for input(s): "VITAMINB12", "FOLATE", "FERRITIN", "TIBC", "IRON", "RETICCTPCT" in the last 72 hours. Urine analysis:    Component Value Date/Time   COLORURINE YELLOW (A) 09/02/2023 2057   APPEARANCEUR CLEAR (A) 09/02/2023 2057   LABSPEC 1.012 09/02/2023 2057   PHURINE 5.0 09/02/2023 2057   GLUCOSEU NEGATIVE 09/02/2023 2057   HGBUR SMALL (A) 09/02/2023 2057   BILIRUBINUR NEGATIVE 09/02/2023 2057   KETONESUR NEGATIVE 09/02/2023 2057   PROTEINUR NEGATIVE 09/02/2023 2057   NITRITE NEGATIVE 09/02/2023 2057   LEUKOCYTESUR TRACE (A) 09/02/2023 2057    Radiological Exams on Admission: DG Chest Port 1 View Result Date: 09/02/2023 CLINICAL DATA:  Chills, nausea, vomiting.  Questionable sepsis EXAM: PORTABLE CHEST 1 VIEW  COMPARISON:  04/06/2018 FINDINGS: Peribronchial thickening in the lower lungs. No focal consolidation, pneumothorax or pleural effusion. Normal cardiomediastinal silhouette. No acute bone abnormality. IMPRESSION: Bronchitis/reactive airways. Electronically Signed   By: Rozell Cornet M.D.   On: 09/02/2023 21:27   Data Reviewed for HPI: Relevant notes from primary care and specialist visits, past discharge summaries as available in EHR, including Care Everywhere. Prior diagnostic testing as pertinent to current admission diagnoses Updated medications and problem lists for reconciliation ED course, including vitals, labs, imaging, treatment and response to treatment Triage notes, nursing and pharmacy notes and ED provider's notes Notable results as noted above in HPI      Assessment and Plan: Respiratory tract infection-acute bronchitis Acute respiratory failure with hypoxia SIRS, possible sepsis with lactic acidosis Sepsis criteria include fever and tachycardia with tachypnea, hypoxia, soft BP and lactic acidosis.  WBC normal and procalcitonin<0.1 Lactic acid 2.2--> 2.8--> 2.5 Respiratory viral panel negative Follow cultures Will continue IV fluids Rocephin and azithromycin  for possible pneumonia given respiratory symptoms DuoNebs as needed Will get a D-dimer to evaluate for need for CTA chest to evaluate for PE---> CTA chest showed "diffuse bronchial wall thickening greatest in the lower lobes", compatible with bronchitis   Nausea and vomiting History of chronic gastritis on EGD 2023 Uncertain etiology, possibly food related, possibly related to coughing from bronchitis, possible gastritis given recent flareup of acid reflux No abdominal pain or change in bowel habits and denies dysuria LFTs WNL, UA WNL.  Will get lipase-->22 IV antiemetics, IV Protonix Clear liquid diet  Elevated troponin Suspect demand ischemia from tachycardia in the 120s Troponin 53-->188--->416,  Denies  chest pain and with nonacute EKG Coronary calcium score of 0 on CTA coronary 01/2022 Patient started on heparin  infusion by ED provider Will continue to trend troponin to peak Continue heparin  infusion for now though low suspicion for type I NSTEMI Echo to evaluate for wall motion abnormality Cardiology consult given upward trend of troponin  Chronic bilateral low back pain with bilateral sciatica History of lumbar laminectomy 2020 History of pelvic fracture 2014 Continue home Lyrica, Zanaflex and hydrocodone pending med reconciliation  Depression Continue home bupropion and Zoloft     DVT prophylaxis:  Heparin  infusion  Consults: Cardiology consult [facility]  Advance Care Planning: full code  Family Communication: none  Disposition Plan: Back to previous home environment  Severity of Illness: The appropriate patient status for this patient is OBSERVATION. Observation status is judged to be reasonable and necessary in order to provide the required intensity of service to ensure the patient's safety. The patient's presenting symptoms, physical exam findings, and initial radiographic and laboratory data in the context of their medical condition is felt to place them at decreased risk for further clinical deterioration. Furthermore, it is anticipated that the patient will be medically stable for discharge from the hospital within 2 midnights of admission.   CRITICAL CARE Performed by: Lanetta Pion   Total critical care time: 80 minutes  Critical care time was exclusive of separately billable procedures and treating other patients.  Critical care was necessary to treat or prevent imminent or life-threatening deterioration.  Critical care was time spent personally by me on the following activities: development of treatment plan with patient and/or surrogate as well as nursing, discussions with consultants, evaluation of patient's response to treatment, examination of patient, obtaining  history from patient or surrogate, ordering and performing treatments and interventions, ordering and review of laboratory studies, ordering and review of radiographic studies, pulse oximetry and re-evaluation of patient's condition.  Author: Lanetta Pion, MD 09/02/2023 11:55 PM  For on call review [email or web address].

## 2023-09-02 NOTE — Assessment & Plan Note (Addendum)
 Acute respiratory failure with hypoxia SIRS, possible sepsis with lactic acidosis Sepsis criteria include fever and tachycardia with tachypnea, hypoxia, soft BP and lactic acidosis.  WBC normal and procalcitonin<0.1 Lactic acid 2.2--> 2.8--> 2.5 Respiratory viral panel negative Follow cultures Will continue IV fluids Rocephin and azithromycin  for possible pneumonia given respiratory symptoms DuoNebs as needed Will get a D-dimer to evaluate for need for CTA chest to evaluate for PE---> CTA chest showed "diffuse bronchial wall thickening greatest in the lower lobes", compatible with bronchitis

## 2023-09-02 NOTE — Assessment & Plan Note (Signed)
 Continue home bupropion and Zoloft 

## 2023-09-02 NOTE — ED Provider Notes (Addendum)
 [facility] Provider Note    Event Date/Time   First MD Initiated Contact with Patient 09/02/23 2036     (approximate)   History   Chills   HPI  Silvia Yong Ellena is a 68 y.o. female with history of anxiety, chronic back pain who comes in with chills.  Patient reports having 3 days of some sinus congestion and hoarse voice.  Denies any sore throat.  Today around 7:00 she started feeling shaky and rigor S.  There was concern for some hypoxia with EMS.  She does report a little bit of a headache but states that she gets migraines.  Denies any falls or hitting her head denies being the worst headache of her life.  She denies any redness on her skin or recent infections of her skin.  She did have some nausea vomiting but she denies any headache.  Physical Exam   Triage Vital Signs: ED Triage Vitals  Encounter Vitals Group     BP --      Systolic BP Percentile --      Diastolic BP Percentile --      Pulse Rate 09/02/23 2042 (!) 124     Resp 09/02/23 2042 (!) 24     Temp 09/02/23 2042 (!) 101.3 F (38.5 C)     Temp Source 09/02/23 2042 Oral     SpO2 09/02/23 2042 97 %     Weight 09/02/23 2045 187 lb 6.3 oz (85 kg)     Height 09/02/23 2045 5\' 3"  (1.6 m)     Head Circumference --      Peak Flow --      Pain Score 09/02/23 2045 6     Pain Loc --      Pain Education --      Exclude from Growth Chart --     Most recent vital signs: Vitals:   09/02/23 2042  Pulse: (!) 124  Resp: (!) 24  Temp: (!) 101.3 F (38.5 C)  SpO2: 97%     General: Awake, no distress.  CV:  Good peripheral perfusion.  Resp:  Normal effort.  Abd:  No distention.  Other:  Patient has raspy voice abdomen soft and nontender.  Patient tachycardic.   ED Results / Procedures / Treatments   Labs (all labs ordered are listed, but only abnormal results are displayed) Labs Reviewed  RESP PANEL BY RT-PCR (RSV, FLU A&B, COVID)  [id number]  CULTURE, BLOOD (ROUTINE X 2)  CULTURE, BLOOD  (ROUTINE X 2)  LACTIC ACID, PLASMA  LACTIC ACID, PLASMA  COMPREHENSIVE METABOLIC PANEL WITH GFR  CBC WITH DIFFERENTIAL/PLATELET  PROTIME-INR  URINALYSIS, W/ REFLEX TO CULTURE (INFECTION SUSPECTED)  BRAIN NATRIURETIC PEPTIDE  PROCALCITONIN  TROPONIN I (HIGH SENSITIVITY)     EKG  My interpretation of EKG:  Sinus tachycardia 127 without any ST elevation, T wave version aVF lead III, normal intervals  RADIOLOGY I have reviewed the xray personally and interpreted no pneumonia   PROCEDURES:  Critical Care performed: Yes, see critical care procedure note(s)  .1-3 Lead EKG Interpretation  Performed by: Lubertha Rush, MD Authorized by: Lubertha Rush, MD     Interpretation: abnormal     ECG rate:  120   ECG rate assessment: tachycardic     Rhythm: sinus tachycardia     Ectopy: none     Conduction: normal   .Critical Care  Performed by: Lubertha Rush, MD Authorized by: Lubertha Rush, MD   Critical  care provider statement:    Critical care time (minutes):  30   Critical care was necessary to treat or prevent imminent or life-threatening deterioration of the following conditions:  Sepsis   Critical care was time spent personally by me on the following activities:  Development of treatment plan with patient or surrogate, discussions with consultants, evaluation of patient's response to treatment, examination of patient, ordering and review of laboratory studies, ordering and review of radiographic studies, ordering and performing treatments and interventions, pulse oximetry, re-evaluation of patient's condition and review of old charts    MEDICATIONS ORDERED IN ED: Medications  ceFEPIme (MAXIPIME) 2 g in sodium chloride  0.9 % 100 mL IVPB (has no administration in time range)  metroNIDAZOLE (FLAGYL) IVPB 500 mg (has no administration in time range)  vancomycin (VANCOCIN) IVPB 1000 mg/200 mL premix (has no administration in time range)  lactated ringers bolus 1,000 mL (has no  administration in time range)  lactated ringers bolus 1,000 mL (has no administration in time range)  acetaminophen (TYLENOL) tablet 1,000 mg (has no administration in time range)     IMPRESSION / MDM / ASSESSMENT AND PLAN / ED COURSE  I reviewed the triage vital signs and the nursing notes.   Patient's presentation is most consistent with acute presentation with potential threat to life or bodily function.   Patient comes in with tachycardia, fever.  Sepsis alert called and broad-spectrum antibiotics were started.  COVID, flu were negative CBC reassuring lites lactate was elevated.  Troponin was slightly elevated we will get a repeat.  Reevaluated patient and she was feeling better without any headache.  Abdomen remains soft and nontender.  Unclear the cause of patient's fever could be viral in nature but given patient's age and significant abnormalities and vital signs we discussed for admission overnight.  She does not have any hypoxia here but there is some question of hypoxia with EMS.  Will continue to be closely monitored.  She got no swelling her legs no calf tenderness to suggest DVT.  Her heart rates have come down with fluids.  Given unclear source for infection we will discuss with the hospital team for admission.   Patient's troponin was rising therefore patient was reevaluated.  She denies any chest pain, shortness of breath currently.  She did have some shortness of breath earlier but is currently on room air.  We will add on D-dimer given she is low risk for PE she is got no swelling her legs that is new, no calf swelling.  It seems less likely PE and I wonder this could be like a myocarditis picture.  Her urine is overall reassuring she really denies any urinary symptoms.  Her lactate is rising but she still getting IV fluids will place a another lactate order.  Will put in for another 500 cc fluid for Ideal bW resusciation. .  I discussed with the hospital team for admission and they  will can follow-up with the D-dimer  Patient was okay with starting heparin she denies any bleeding issues any headaches at this time.    The patient is on the cardiac monitor to evaluate for evidence of arrhythmia and/or significant heart rate changes.      FINAL CLINICAL IMPRESSION(S) / ED DIAGNOSES   Final diagnoses:  Sepsis, due to unspecified organism, unspecified whether acute organ dysfunction present [facility])     Rx / DC Orders   ED Discharge Orders     None  Note:  This document was prepared using Dragon voice recognition software and may include unintentional dictation errors.   Lubertha Rush, MD 09/02/23 2316    Lubertha Rush, MD 09/02/23 4259    Lubertha Rush, MD 09/02/23 [phone]

## 2023-09-03 ENCOUNTER — Observation Stay
Admit: 2023-09-03 | Discharge: 2023-09-03 | Disposition: A | Discharge status: Home / Self Care | Attending: Internal Medicine

## 2023-09-03 ENCOUNTER — Observation Stay

## 2023-09-03 ENCOUNTER — Encounter: Payer: Self-pay | Admitting: Internal Medicine

## 2023-09-03 ENCOUNTER — Observation Stay (HOSPITAL_BASED_OUTPATIENT_CLINIC_OR_DEPARTMENT_OTHER)
Admit: 2023-09-03 | Discharge: 2023-09-03 | Disposition: A | Discharge status: Home / Self Care | Attending: Internal Medicine

## 2023-09-03 DIAGNOSIS — R6510 Systemic inflammatory response syndrome (SIRS) of non-infectious origin without acute organ dysfunction: Secondary | ICD-10-CM | POA: Diagnosis not present

## 2023-09-03 DIAGNOSIS — J9809 Other diseases of bronchus, not elsewhere classified: Secondary | ICD-10-CM | POA: Diagnosis not present

## 2023-09-03 DIAGNOSIS — R6883 Chills (without fever): Secondary | ICD-10-CM | POA: Diagnosis not present

## 2023-09-03 DIAGNOSIS — R7989 Other specified abnormal findings of blood chemistry: Secondary | ICD-10-CM | POA: Diagnosis not present

## 2023-09-03 DIAGNOSIS — A419 Sepsis, unspecified organism: Secondary | ICD-10-CM

## 2023-09-03 DIAGNOSIS — J069 Acute upper respiratory infection, unspecified: Secondary | ICD-10-CM | POA: Diagnosis not present

## 2023-09-03 DIAGNOSIS — I2489 Other forms of acute ischemic heart disease: Secondary | ICD-10-CM

## 2023-09-03 DIAGNOSIS — R112 Nausea with vomiting, unspecified: Secondary | ICD-10-CM | POA: Diagnosis not present

## 2023-09-03 DIAGNOSIS — J209 Acute bronchitis, unspecified: Secondary | ICD-10-CM | POA: Diagnosis not present

## 2023-09-03 LAB — HIV ANTIBODY (ROUTINE TESTING W REFLEX): HIV Screen 4th Generation wRfx: NONREACTIVE

## 2023-09-03 LAB — LACTIC ACID, PLASMA: Lactic Acid, Venous: 2.5 mmol/L (ref 0.5–1.9)

## 2023-09-03 LAB — HEPARIN LEVEL (UNFRACTIONATED): Heparin Unfractionated: 0.14 [IU]/mL — ABNORMAL LOW (ref 0.30–0.70)

## 2023-09-03 LAB — LIPASE, BLOOD: Lipase: 22 U/L (ref 11–51)

## 2023-09-03 LAB — ECHOCARDIOGRAM COMPLETE
AR max vel: 2.81 cm2
AV Peak grad: 6.5 mmHg
Ao pk vel: 1.27 m/s
Area-P 1/2: 3.68 cm2
Height: 63 in
S' Lateral: 3.1 cm
Weight: 2998.26 [oz_av]

## 2023-09-03 LAB — TROPONIN I (HIGH SENSITIVITY)
Troponin I (High Sensitivity): 416 ng/L (ref <18)
Troponin I (High Sensitivity): 255 ng/L (ref <18)

## 2023-09-03 LAB — D-DIMER, QUANTITATIVE: D-Dimer, Quant: 1.16 ug{FEU}/mL — ABNORMAL HIGH (ref 0.00–0.50)

## 2023-09-03 MED ORDER — ACETAMINOPHEN 325 MG PO TABS: 650.0000 mg | ORAL_TABLET | ORAL | Status: DC | PRN

## 2023-09-03 MED ORDER — TIZANIDINE HCL 4 MG PO TABS: 4.0000 mg | ORAL_TABLET | Freq: Four times a day (QID) | ORAL | Status: DC | PRN

## 2023-09-03 MED ORDER — HYDROCODONE-ACETAMINOPHEN 5-325 MG PO TABS
1.0000 | ORAL_TABLET | ORAL | Status: DC | PRN
  Administered 2023-09-03: 1 via ORAL
  Filled 2023-09-03: qty 1

## 2023-09-03 MED ORDER — PANTOPRAZOLE SODIUM 40 MG IV SOLR
40.0000 mg | INTRAVENOUS | Status: DC
  Administered 2023-09-03 (×2): 40 mg via INTRAVENOUS
  Filled 2023-09-03 (×2): qty 10

## 2023-09-03 MED ORDER — SODIUM CHLORIDE 0.9 % IV SOLN
2.0000 g | INTRAVENOUS | Status: DC
  Administered 2023-09-03 – 2023-09-04 (×2): 2 g via INTRAVENOUS
  Filled 2023-09-03 (×2): qty 20

## 2023-09-03 MED ORDER — IOHEXOL 350 MG/ML SOLN
100.0000 mL | Freq: Once | INTRAVENOUS | Status: AC | PRN
  Administered 2023-09-03: 100 mL via INTRAVENOUS

## 2023-09-03 MED ORDER — HEPARIN BOLUS VIA INFUSION
2100.0000 [IU] | Freq: Once | INTRAVENOUS | Status: AC
  Administered 2023-09-03: 2100 [IU] via INTRAVENOUS
  Filled 2023-09-03: qty 2100

## 2023-09-03 MED ORDER — ATORVASTATIN CALCIUM 20 MG PO TABS
40.0000 mg | ORAL_TABLET | Freq: Every day | ORAL | Status: DC
  Administered 2023-09-03 – 2023-09-04 (×2): 40 mg via ORAL
  Filled 2023-09-03 (×2): qty 2

## 2023-09-03 MED ORDER — ONDANSETRON HCL 4 MG/2ML IJ SOLN: 4.0000 mg | Freq: Four times a day (QID) | INTRAMUSCULAR | Status: DC | PRN

## 2023-09-03 MED ORDER — ENOXAPARIN SODIUM 40 MG/0.4ML IJ SOSY: 40.0000 mg | PREFILLED_SYRINGE | INTRAMUSCULAR | Status: DC

## 2023-09-03 MED ORDER — MELATONIN 5 MG PO TABS
10.0000 mg | ORAL_TABLET | Freq: Once | ORAL | Status: AC
  Administered 2023-09-03: 10 mg via ORAL
  Filled 2023-09-03: qty 2

## 2023-09-03 MED ORDER — SERTRALINE HCL 50 MG PO TABS
100.0000 mg | ORAL_TABLET | Freq: Every day | ORAL | Status: DC
Start: 2023-09-03 — End: 2023-09-04
  Administered 2023-09-03 – 2023-09-04 (×2): 100 mg via ORAL
  Filled 2023-09-03 (×2): qty 2

## 2023-09-03 MED ORDER — GUAIFENESIN 100 MG/5ML PO LIQD
5.0000 mL | ORAL | Status: DC | PRN
  Administered 2023-09-03: 5 mL via ORAL
  Filled 2023-09-03: qty 10

## 2023-09-03 MED ORDER — ASPIRIN 81 MG PO TBEC
81.0000 mg | DELAYED_RELEASE_TABLET | Freq: Every day | ORAL | Status: DC
  Administered 2023-09-04: 81 mg via ORAL
  Filled 2023-09-03: qty 1

## 2023-09-03 MED ORDER — LACTATED RINGERS IV SOLN
150.0000 mL/h | INTRAVENOUS | Status: AC
  Administered 2023-09-03: 150 mL/h via INTRAVENOUS

## 2023-09-03 MED ORDER — PANTOPRAZOLE SODIUM 40 MG IV SOLR
40.0000 mg | INTRAVENOUS | Status: DC
Start: 2023-09-03 — End: 2023-09-03

## 2023-09-03 MED ORDER — SODIUM CHLORIDE 0.9 % IV SOLN
500.0000 mg | INTRAVENOUS | Status: DC
  Administered 2023-09-03 (×2): 500 mg via INTRAVENOUS
  Filled 2023-09-03 (×2): qty 5

## 2023-09-03 MED ORDER — ENOXAPARIN SODIUM 40 MG/0.4ML IJ SOSY
40.0000 mg | PREFILLED_SYRINGE | INTRAMUSCULAR | Status: DC
  Administered 2023-09-03: 40 mg via SUBCUTANEOUS
  Filled 2023-09-03: qty 0.4

## 2023-09-03 NOTE — Progress Notes (Signed)
 PHARMACY - ANTICOAGULATION CONSULT NOTE  Pharmacy Consult for Heparin   Indication: chest pain/ACS  Allergies  Allergen Reactions   Ibuprofen Other (See Comments)    Causes heartburn, burning in her throat.   Penicillins Rash    Patient Measurements: Height: 5\' 3"  (160 cm) Weight: 85 kg (187 lb 6.3 oz) IBW/kg (Calculated) : 52.4 HEPARIN  DW (KG): 71.4  Vital Signs: Temp: 100.3 F (37.9 C) (05/08 2336) Temp Source: Oral (05/08 2336) BP: 101/68 (05/08 2330) Pulse Rate: 96 (05/08 2330)  Labs: Recent Labs    09/02/23 2101 09/02/23 2228  HGB 12.5  --   HCT 37.2  --   PLT 209  --   LABPROT 12.9  --   INR 1.0  --   CREATININE 1.07*  --   TROPONINIHS 53* 188*    Estimated Creatinine Clearance: 50.5 mL/min (A) (by C-G formula based on SCr of 1.07 mg/dL (H)).   Medical History: Past Medical History:  Diagnosis Date   GERD (gastroesophageal reflux disease)    Migraine    Neck pain, chronic    Spinal stenosis     Medications:  (Not in a hospital admission)   Assessment: Pharmacy consulted to dose heparin  in this 68 year old female admitted with ACS/NSTEMI.  No prior anticoag noted. CrCl = 50.5 ml/min   Goal of Therapy:  Heparin  level 0.3-0.7 units/ml Monitor platelets by anticoagulation protocol: Yes   Plan:  Give 4000 units bolus x 1 Start heparin  infusion at 1000 units/hr Check anti-Xa level in 6 hours and daily while on heparin  Continue to monitor H&H and platelets  Morris Markham D 09/03/2023,12:42 AM

## 2023-09-03 NOTE — Progress Notes (Signed)
 PHARMACY - ANTICOAGULATION CONSULT NOTE  Pharmacy Consult for Heparin   Indication: chest pain/ACS  Allergies  Allergen Reactions   Ibuprofen Other (See Comments)    Causes heartburn, burning in her throat.   Penicillins Rash    Patient Measurements: Height: 5\' 3"  (160 cm) Weight: 85 kg (187 lb 6.3 oz) IBW/kg (Calculated) : 52.4 HEPARIN  DW (KG): 71.4  Vital Signs: Temp: 98.7 F (37.1 C) (05/09 0446) Temp Source: Oral (05/09 0446) BP: 102/77 (05/09 0700) Pulse Rate: 68 (05/09 0700)  Labs: Recent Labs    09/02/23 2101 09/02/23 2228 09/03/23 0145 09/03/23 0643  HGB 12.5  --   --   --   HCT 37.2  --   --   --   PLT 209  --   --   --   LABPROT 12.9  --   --   --   INR 1.0  --   --   --   HEPARINUNFRC  --   --   --  0.14*  CREATININE 1.07*  --   --   --   TROPONINIHS 53* 188* 416* 255*    Estimated Creatinine Clearance: 50.5 mL/min (A) (by C-G formula based on SCr of 1.07 mg/dL (H)).   Medical History: Past Medical History:  Diagnosis Date   Depression    Diastolic dysfunction    a. 01/2022 Echo: EF of 60 to 65% with no rwma, gr1 DD, normal RV fxn, and no evidence of MR.   Dyspnea    a. 01/2022 Echo: Nl LV/RV fxn w/o valvular dzs. GrI DD; b. 01/2022 Cardiac CT: Ca2+ = 0.   GERD (gastroesophageal reflux disease)    Migraine    Neck pain, chronic    Primary hypertension    Spinal stenosis     Medications:  (Not in a hospital admission)   Assessment: Pharmacy consulted to dose heparin  in this 68 year old female admitted with ACS/NSTEMI.  No prior anticoag noted.  Goal of Therapy:  Heparin  level 0.3-0.7 units/ml Monitor platelets by anticoagulation protocol: Yes   Plan: heparin  level subtherapeutic ---Give 2100 units heparin  IV bolus x 1 ---increase heparin  infusion to 1300 units/hr ---Check anti-Xa level in 8 hours after rate change ---Continue to monitor H&H and platelets  Adalberto Acton 09/03/2023,8:12 AM

## 2023-09-03 NOTE — Consult Note (Signed)
 Cardiology Consult    Patient ID: Robin Ochoa MRN: [id number], DOB/AGE: February 25, 1956   Admit date: 09/02/2023 Date of Consult: 09/03/2023  Primary Physician: Mai Schwalbe, FNP Primary Cardiologist: Belva Boyden, MD Requesting Provider: Godfrey Laster, MD  Patient Profile    Robin Ochoa is a 68 y.o. female with a history of mitral regurgitation, primary hypertension, pelvic fracture in 2014, degenerative disc disease status post lumbar laminectomy 2022, and depression, who is being seen today for the evaluation of troponin elevation in the setting of resp failure/bronchitis at the request of Dr. Meyer Ada.  Past Medical History   Subjective  Past Medical History:  Diagnosis Date   Depression    Diastolic dysfunction    a. 01/2022 Echo: EF of 60 to 65% with no rwma, gr1 DD, normal RV fxn, and no evidence of MR.   Dyspnea    a. 01/2022 Echo: Nl LV/RV fxn w/o valvular dzs. GrI DD; b. 01/2022 Cardiac CT: Ca2+ = 0.   GERD (gastroesophageal reflux disease)    Migraine    Neck pain, chronic    Primary hypertension    Spinal stenosis     Past Surgical History:  Procedure Laterality Date   BACK SURGERY     COLONOSCOPY WITH PROPOFOL  N/A 10/07/2020   Procedure: COLONOSCOPY WITH PROPOFOL ;  Surgeon: Luke Salaam, MD;  Location: [facility] ENDOSCOPY;  Service: Gastroenterology;  Laterality: N/A;   COLONOSCOPY WITH PROPOFOL  N/A 11/14/2020   Procedure: COLONOSCOPY WITH PROPOFOL ;  Surgeon: Luke Salaam, MD;  Location: [facility] ENDOSCOPY;  Service: Gastroenterology;  Laterality: N/A;   COLONOSCOPY WITH PROPOFOL  N/A 11/13/2021   Procedure: COLONOSCOPY WITH PROPOFOL ;  Surgeon: Luke Salaam, MD;  Location: [facility] ENDOSCOPY;  Service: Gastroenterology;  Laterality: N/A;  3rd case   ESOPHAGOGASTRODUODENOSCOPY  11/13/2021   Procedure: ESOPHAGOGASTRODUODENOSCOPY (EGD);  Surgeon: Luke Salaam, MD;  Location: [facility] ENDOSCOPY;  Service: Gastroenterology;;   ESOPHAGOGASTRODUODENOSCOPY (EGD) WITH PROPOFOL  N/A 05/16/2021    Procedure: ESOPHAGOGASTRODUODENOSCOPY (EGD) WITH PROPOFOL ;  Surgeon: Luke Salaam, MD;  Location: [facility] ENDOSCOPY;  Service: Gastroenterology;  Laterality: N/A;   VAGINAL HYSTERECTOMY  68 years old   Tumors-      Allergies  Allergies  Allergen Reactions   Ibuprofen Other (See Comments)    Causes heartburn, burning in her throat.   Penicillins Rash       History of Present Illness   68 year old female with a history of mitral regurgitation, primary hypertension, pelvic fracture in 2014, degenerative disc disease status post lumbar laminectomy 2022, and depression.  Prior echocardiogram in 2014 suggested moderate mitral regurgitation.  She subsequently established care with Dr. Gollan in 2017.  Echocardiogram in October 2023 showed an EF of 60 to 65% with no regional wall motion abnormalities, grade 1 diastolic dysfunction, normal RV function, and no evidence of mitral regurgitation.  CT for coronary calcium in October 2023, showed a calcium score of 0.  Robin Ochoa lives locally with her husband.  In the setting of chronic low back pain, she does not routinely exercise.  She is able to complete chores in and around her home without chest pain or dyspnea.  She was in her usual state of health until approximately May 5, when she started developing a cough which was mildly productive with clear mucus followed by hoarseness of her voice.  She did not initially have fevers or chills.  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  Upon returning home, she ate a salad from [employer] and then took a nap.  She got up around 7 PM to formally go to bed and upon laying down, had sudden onset of chills and nausea.  She vomited x 1.  She subsequently became acutely dyspneic.  Her husband called EMS and she was found to be hypoxic with a saturation of 88% on room  air.  She was placed on 5 L with rise to 94%.  On arrival to the ED, she was febrile @ 101.3, tachycardic @ 124 (sinus), tachypneic (24), and normotensive.  ECG showed sinus tachycardia at 127 with subtle inferolateral ST segment depression.  Labs notable for mild elevation of creatinine above prior baseline at 1.07.  Glucose 173.  Calcium 8.8.  Total protein 6.1.  Initial lactic acid 2.2, rising to 2.8.  Procalcitonin less than 0.10.  BNP 25.6.  High-sensitivity troponin 53 with subsequent values of 188  416.  Respiratory panel negative.  Chest x-ray showed bronchitis reactive airways.  CTA of the chest was negative for PE and showed diffuse bronchial wall thickening greatest in the lower lobes, compatible with bronchitis.  She was treated with acetaminophen , cefepime  followed by ceftriaxone, Flagyl , vancomycin , and 2.5 L boluses of lactated ringers  followed by infusion.  Currently, Robin Ochoa is feeling much better.  She remains hoarse but breathing is stable and she is not requiring oxygen.  She denies chest pain or dyspnea at rest.  Inpatient Medications   Subjective    [START ON 09/04/2023] aspirin EC  81 mg Oral Daily   heparin   2,100 Units Intravenous Once   pantoprazole (PROTONIX) IV  40 mg Intravenous Q24H   sertraline   100 mg Oral Daily    Family History    Family History  Problem Relation Age of Onset   Heart failure Mother    Stomach cancer Father 55   Ovarian cancer Maternal Aunt    Ovarian cancer Maternal Aunt    Cancer Maternal Aunt        unk type; female origin   Lung cancer Paternal Uncle    Stomach cancer Paternal Grandmother        d. 22   Stomach cancer Other    Colon cancer Cousin        2 mat cousins; both d. 90s   Breast cancer Cousin        maternal cousin dx late 14s   She indicated that her mother is deceased. She indicated that her father is deceased. She indicated that her maternal grandmother is deceased. She indicated that her maternal grandfather is  deceased. She indicated that her paternal grandmother is deceased. She indicated that her paternal grandfather is deceased. She indicated that all of her three maternal aunts are deceased. She indicated that her paternal uncle is deceased. She indicated that only one of her two cousins is alive. She indicated that her other is deceased.   Social History    Social History   Socioeconomic History   Marital status: Married    Spouse name: Not on file   Number of children: Not on file   Years of education: Not on file   Highest education level: Not on file  Occupational History   Not on file  Tobacco Use   Smoking status: Never   Smokeless tobacco: Never  Vaping Use   Vaping status: Never Used  Substance and Sexual Activity   Alcohol  use: Not Currently    Comment: occ   Drug use: No   Sexual activity: Not on file  Other Topics Concern   Not on file  Social History Narrative   Not on file   Social Drivers of Health   Financial Resource Strain: Low Risk  (10/09/2021)   Received from [facility], [facility]   Overall Financial Resource Strain (CARDIA)    Difficulty of Paying Living Expenses: Not hard at all  Food Insecurity: No Food Insecurity (09/03/2023)   Hunger Vital Sign    Worried About Running Out of Food in the Last Year: Never true    Ran Out of Food in the Last Year: Never true  Transportation Needs: No Transportation Needs (09/03/2023)   PRAPARE - Administrator, Civil Service (Medical): No    Lack of Transportation (Non-Medical): No  Physical Activity: Inactive (10/09/2021)   Received from [facility], [facility]   Exercise Vital Sign    Days of Exercise per Week: 0 days    Minutes of Exercise per Session: 0 min  Stress: No Stress Concern Present (10/09/2021)   Received from [facility], [facility], [facility], [facility]   [facility] - Occupational Stress Questionnaire    Feeling of Stress : Not at all  Social  Connections: Socially Integrated (09/03/2023)   Social Connection and Isolation Panel [NHANES]    Frequency of Communication with Friends and Family: More than three times a week    Frequency of Social Gatherings with Friends and Family: More than three times a week    Attends Religious Services: 1 to 4 times per year    Active Member of [employer] or Organizations: Yes    Attends Engineer, structural: More than 4 times per year    Marital Status: Married  Catering manager Violence: Not At Risk (09/03/2023)   Humiliation, Afraid, Rape, and Kick questionnaire    Fear of Current or Ex-Partner: No    Emotionally Abused: No    Physically Abused: No    Sexually Abused: No     Review of Systems    General:  +++ chills, +++ fever, +++ malaise, no night sweats or weight changes.  Cardiovascular:  No chest pain, +++ dyspnea on exertion, no edema, orthopnea, palpitations, paroxysmal nocturnal dyspnea. Dermatological: No rash, lesions/masses Respiratory: +++ Productive cough, +++ dyspnea Urologic: No hematuria, dysuria Abdominal:   +++ nausea, +++ vomiting, no diarrhea, bright red blood per rectum, melena, or hematemesis Neurologic:  No visual changes, wkns, changes in mental status. All other systems reviewed and are otherwise negative except as noted above.     Objective   Physical Exam    Blood pressure 102/77, pulse 68, temperature 98.7 F (37.1 C), temperature source Oral, resp. rate 17, height 5\' 3"  (1.6 m), weight 85 kg, SpO2 95%.  General: Pleasant, NAD.  Voice is hoarse. Psych: Normal affect. Neuro: Alert and oriented X 3. Moves all extremities spontaneously. HEENT: Normal  Neck: Supple without bruits or JVD. Lungs:  Resp regular and unlabored, CTA. Heart: RRR no s3, s4, or murmurs. Abdomen: Soft, non-tender, non-distended, BS + x 4.  Extremities: No clubbing, cyanosis or edema. DP/PT2+, Radials 2+ and equal bilaterally.  Labs    Cardiac Enzymes Recent Labs  Lab  09/02/23 2101 09/02/23 2228 09/03/23 0145 09/03/23 0643  TROPONINIHS 53* 188* 416* 255*     BNP    Component Value Date/Time   BNP 25.6 09/02/2023  2057   Lab Results  Component Value Date   WBC 6.5 09/02/2023   HGB 12.5 09/02/2023   HCT 37.2 09/02/2023   MCV 96.6 09/02/2023   PLT 209 09/02/2023    Recent Labs  Lab 09/02/23 2101  NA 139  K 3.7  CL 108  CO2 22  BUN 21  CREATININE 1.07*  CALCIUM 8.8*  PROT 6.1*  BILITOT 0.4  ALKPHOS 85  ALT 18  AST 24  GLUCOSE 173*   Lab Results  Component Value Date   CHOL 197 11/20/2015   HDL 41 (L) 11/20/2015   LDLCALC NOT CALC 11/20/2015   TRIG 504 (H) 11/20/2015   Lab Results  Component Value Date   DDIMER 1.16 (H) 09/02/2023      Radiology Studies    CT Angio Chest Pulmonary Embolism (PE) W or WO Contrast Result Date: 09/03/2023 CLINICAL DATA:  Chills, nausea, vomiting for 1-1/2 hours.  Headache. EXAM: CT ANGIOGRAPHY CHEST WITH CONTRAST TECHNIQUE: Multidetector CT imaging of the chest was performed using the standard protocol during bolus administration of intravenous contrast. Multiplanar CT image reconstructions and MIPs were obtained to evaluate the vascular anatomy. RADIATION DOSE REDUCTION: This exam was performed according to the departmental dose-optimization program which includes automated exposure control, adjustment of the mA and/or kV according to patient size and/or use of iterative reconstruction technique. CONTRAST:  OMNIPAQUE IOHEXOL 350 MG/ML SOLN COMPARISON:  Chest radiograph 09/02/2023 and cardiac CT 02/18/2022 FINDINGS: Cardiovascular: No pericardial effusion. Normal caliber thoracic aorta. Negative for acute pulmonary embolism. Mediastinum/Nodes: Bowing of the posterior trachea compatible with expiratory phase imaging. Esophagus is unremarkable. No thoracic adenopathy. Lungs/Pleura: Diffuse bronchial wall thickening greatest in the lower lobes. No focal consolidation, pleural effusion, or pneumothorax.  Upper Abdomen: No acute abnormality. Musculoskeletal: No acute fracture. Review of the MIP images confirms the above findings. IMPRESSION: 1. Negative for acute pulmonary embolism. 2. Diffuse bronchial wall thickening greatest in the lower lobes, compatible with bronchitis. Electronically Signed   By: Rozell Cornet M.D.   On: 09/03/2023 00:52   DG Chest Port 1 View Result Date: 09/02/2023 CLINICAL DATA:  Chills, nausea, vomiting.  Questionable sepsis EXAM: PORTABLE CHEST 1 VIEW COMPARISON:  04/06/2018 FINDINGS: Peribronchial thickening in the lower lungs. No focal consolidation, pneumothorax or pleural effusion. Normal cardiomediastinal silhouette. No acute bone abnormality. IMPRESSION: Bronchitis/reactive airways. Electronically Signed   By: Rozell Cornet M.D.   On: 09/02/2023 21:27      ECG & Cardiac Imaging    Sep 02, 2023 at 20:42 -sinus tachycardia, 127, subtle inferolateral ST segment depression- personally reviewed.  Sep 02, 2023 at 2335 -regular sinus rhythm, 96, no acute ST or T changes-personally reviewed.  Assessment & Plan    1.  Demand ischemia: Patient with prior history of calcium score 0 in 2023 with normal LV function without significant valvular disease in October 2023.  She presented with a 3-Crescenzo history of hoarseness, productive cough, and 1 Teater history of significant malaise, fever, chills, nausea, vomiting, and dyspnea with saturations of 88% as well as sinus tachycardia upon EMS arrival.  Found to be acidotic with a lactate of 2.8.  CTA chest negative for PE but showed diffuse bronchial wall thickening greatest in the lower lobes compatible with bronchitis.  Blood pressure soft.  She has been treated for respiratory failure/bronchitis/SIRS with antibiotics and IV fluids.  In the setting of this presentation, troponin peaked at 416 and is since downtrending.  She denies any prior history of chest pain or  dyspnea with activity in the outpatient setting.  Suspect demand ischemia.   Continue aspirin and heparin .  Add statin for the time being and follow-up lipids (LDL of 96 in October 2024).  Follow-up echocardiogram.  Provided that LV function is normal without wall motion abnormalities, would defer any ischemic evaluation at this time but would consider in the outpatient setting, following recovery from respiratory infection.  2.  Acute respiratory failure/bronchitis/SIRS: Antibiotics and IV fluids per medicine team.  Already feeling much better.  Lungs are clear this morning.  3: Primary hypertension: Blood pressure soft.  Home dose of HCTZ on hold.  Risk Assessment/Risk Scores:     TIMI Risk Score for Unstable Angina or Non-ST Elevation MI:   The patient's TIMI risk score is 2, which indicates a 8% risk of all cause mortality, new or recurrent myocardial infarction or need for urgent revascularization in the next 14 days.      Signed, Laneta Pintos, NP 09/03/2023, 8:58 AM  For questions or updates, please contact   Please consult [email or web address] for contact info under Cardiology/STEMI.

## 2023-09-03 NOTE — Progress Notes (Signed)
 Echocardiogram 2D Echocardiogram has been performed.  Robin Ochoa 09/03/2023, 4:19 PM

## 2023-09-03 NOTE — Progress Notes (Signed)
 Progress Note   Patient: Robin Ochoa DOB: [birth year] DOA: 09/02/2023     0 DOS: the patient was seen and examined on 09/03/2023   Brief hospital course: Robin Ochoa is a 68 y.o. female with medical history significant for Pelvic fracture 2014, lumbar laminectomy 2020, HTN, depression, who presented by EMS with chills, nonbloody, nonbilious vomiting starting a couple hours prior to arrival.  She had a salad from a restaurant prior to the onset and wonders if she got food poisoning.  She denies abdominal pain or diarrhea but stated that a few days prior she had a bad case of acid reflux.  Additionally, she has 2-Puig history of congestion, nonproductive cough and hoarseness.  She denies fever. Denies chest pain.   Denies dysuria.  Denies back or neck pain.  With EMS she was tachycardic to 126 and normothermic with O2 sat 88% on room air requiring 5 L to get sats to the mid 90s. ED course and data review: On arrival temp of 101.3 and tachycardic to 124, tachypneic to 24, soft BP  103/52.  O2 sat on 4 L 97%. Notable labs include normal WBC but with lactic acid 2.4--> 2.8.  Procalcitonin <0.10. Troponin 53--> 188, BNP 25 Hemoglobin normal Creatinine 1.07 Glucose 173 Urinalysis with trace leukocytes Respiratory viral panel negative for COVID flu and RSV   EKG, personally reviewed and interpreted showing sinus at 96 Chest x-ray showing bronchitis/reactive airways   Patient treated with LR bolus and started on cefepime , metronidazole  and Flagyl  for sepsis of unknown source   Hospitalist consulted for admission.        Assessment and Plan:   Respiratory tract infection-acute bronchitis Acute respiratory failure with hypoxia SIRS, possible sepsis with lactic acidosis Sepsis criteria on admission includes skin. Fever and tachycardia with tachypnea, hypoxia, soft BP and lactic acidosis.  WBC normal and procalcitonin<0.1 Lactic acid 2.2--> 2.8--> 2.5 Respiratory viral panel  negative CT angiogram of the chest was negative for PE but showed diffuse bronchial wall thickening greatest in the lower lobes, compatible with bronchitis. Continue IV fluids Continue IV Rocephin and azithromycin        Nausea and vomiting History of chronic gastritis on EGD 2023 Resolved Uncertain etiology, possibly food poisoning.   Supportive care with PPI, antiemetics     Elevated troponin Myocardial injury Suspect demand ischemia from tachycardia with heart rate in the 120s on admission due to respiratory infection Troponin 53-->188--->416,  Denies chest pain and with nonacute EKG Coronary calcium score of 0 on CTA coronary 01/2022 Appreciate cardiology input, will discontinue IV heparin  Follow-up results of 2D echocardiogram to evaluate regional wall motion abnormality Ischemic evaluation is not planned at this time an outpatient stress test may be considered on discharge.    Chronic bilateral low back pain with bilateral sciatica History of lumbar laminectomy 2020 History of pelvic fracture 2014 Continue home Lyrica, Zanaflex and hydrocodone pending med reconciliation    Depression Continue home bupropion and Zoloft      Obesity BMI 33 Complicates overall prognosis and care Lifestyle modification and exercise has been discussed with patient.    Depression Continue Wellbutrin      Subjective: Patient is seen and examined at the bedside.  Has not had any further episodes of fever.  Physical Exam: Vitals:   09/03/23 0700 09/03/23 0800 09/03/23 0900 09/03/23 1249  BP: 102/77 124/70 (!) 107/47 (!) 128/109  Pulse: 68 79 73 81  Resp: 17 (!) 34 19 17  Temp:  98.4 F (  36.9 C) 98.2 F (36.8 C)   TempSrc:  Oral Oral   SpO2: 95% 95% 96% 100%  Weight:      Height:        Vitals and nursing note reviewed.  Constitutional:      General: She is not in acute distress.  Obese HENT:     Head: Normocephalic and atraumatic.  Cardiovascular:     Rate and  Rhythm: Regular rhythm.     Heart sounds: Normal heart sounds.  Pulmonary:     Effort: Scattered expiratory wheezes.     Breath sounds: Normal breath sounds.  Abdominal:     Palpations: Abdomen is soft.     Tenderness: There is no abdominal tenderness.  Neurological:     Mental Status: Mental status is at baseline.    Data Reviewed: D-dimer 1.16, troponin 188 >> 416>> 255 There are no new results to review at this time.  Family Communication: Plan of care was discussed with patient in detail.  She verbalizes understanding and agrees with the plan.  Disposition: Status is: Observation The patient remains OBS appropriate and will d/c before 2 midnights.  Planned Discharge Destination: Home    Time spent: 38 minutes  Author: Read Camel, MD 09/03/2023 2:12 PM  For on call review [email or web address].

## 2023-09-03 NOTE — Hospital Course (Signed)
 Robin Ochoa

## 2023-09-03 NOTE — Care Management Obs Status (Signed)
 MEDICARE OBSERVATION STATUS NOTIFICATION   Patient Details  Name: Robin Ochoa MRN: [id number] Date of Birth: 03-05-1956   Medicare Observation Status Notification Given:  Rudolph Cost, CMA 09/03/2023, 2:53 PM

## 2023-09-04 DIAGNOSIS — A419 Sepsis, unspecified organism: Secondary | ICD-10-CM | POA: Diagnosis not present

## 2023-09-04 DIAGNOSIS — R112 Nausea with vomiting, unspecified: Secondary | ICD-10-CM

## 2023-09-04 DIAGNOSIS — J988 Other specified respiratory disorders: Secondary | ICD-10-CM

## 2023-09-04 DIAGNOSIS — J209 Acute bronchitis, unspecified: Secondary | ICD-10-CM | POA: Diagnosis not present

## 2023-09-04 DIAGNOSIS — R6510 Systemic inflammatory response syndrome (SIRS) of non-infectious origin without acute organ dysfunction: Secondary | ICD-10-CM

## 2023-09-04 DIAGNOSIS — E872 Acidosis, unspecified: Secondary | ICD-10-CM

## 2023-09-04 DIAGNOSIS — J069 Acute upper respiratory infection, unspecified: Secondary | ICD-10-CM | POA: Diagnosis not present

## 2023-09-04 LAB — CBC
HCT: 37.8 % (ref 36.0–46.0)
Hemoglobin: 12.6 g/dL (ref 12.0–15.0)
MCH: 32.6 pg (ref 26.0–34.0)
MCHC: 33.3 g/dL (ref 30.0–36.0)
MCV: 97.7 fL (ref 80.0–100.0)
Platelets: 179 10*3/uL (ref 150–400)
RBC: 3.87 MIL/uL (ref 3.87–5.11)
RDW: 12.7 % (ref 11.5–15.5)
WBC: 5.8 10*3/uL (ref 4.0–10.5)
nRBC: 0 % (ref 0.0–0.2)

## 2023-09-04 LAB — BASIC METABOLIC PANEL WITH GFR
Anion gap: 6 (ref 5–15)
BUN: 13 mg/dL (ref 8–23)
CO2: 27 mmol/L (ref 22–32)
Calcium: 8.9 mg/dL (ref 8.9–10.3)
Chloride: 109 mmol/L (ref 98–111)
Creatinine, Ser: 0.96 mg/dL (ref 0.44–1.00)
GFR, Estimated: >60 mL/min (ref >60)
Glucose, Bld: 118 mg/dL — ABNORMAL HIGH (ref 70–99)
Potassium: 3.9 mmol/L (ref 3.5–5.1)
Sodium: 142 mmol/L (ref 135–145)

## 2023-09-04 LAB — LIPID PANEL
Cholesterol: 145 mg/dL (ref 0–200)
HDL: 42 mg/dL (ref >40)
LDL Cholesterol: 77 mg/dL (ref 0–99)
Total CHOL/HDL Ratio: 3.5 ratio
Triglycerides: 131 mg/dL (ref <150)
VLDL: 26 mg/dL (ref 0–40)

## 2023-09-04 LAB — URINE CULTURE

## 2023-09-04 LAB — LACTIC ACID, PLASMA: Lactic Acid, Venous: 2.4 mmol/L (ref 0.5–1.9)

## 2023-09-04 LAB — LIPOPROTEIN A (LPA): Lipoprotein (a): <8.4 nmol/L (ref <75.0)

## 2023-09-04 MED ORDER — PANTOPRAZOLE SODIUM 40 MG PO TBEC: 40.0000 mg | DELAYED_RELEASE_TABLET | Freq: Every day | ORAL | Status: DC

## 2023-09-04 MED ORDER — AZITHROMYCIN 250 MG PO TABS: ORAL_TABLET | ORAL | 0 refills | Status: AC

## 2023-09-04 MED ORDER — ATORVASTATIN CALCIUM 40 MG PO TABS: 40.0000 mg | ORAL_TABLET | Freq: Every day | ORAL | 0 refills | Status: DC

## 2023-09-04 MED ORDER — GUAIFENESIN ER 600 MG PO TB12: 600.0000 mg | ORAL_TABLET | Freq: Two times a day (BID) | ORAL | 0 refills | Status: AC

## 2023-09-04 NOTE — Plan of Care (Signed)
  Problem: Education: Goal: Understanding of cardiac disease, CV risk reduction, and recovery process will improve Outcome: Progressing Goal: Individualized Educational Video(s) Outcome: Progressing   Problem: Activity: Goal: Ability to tolerate increased activity will improve Outcome: Progressing   Problem: Cardiac: Goal: Ability to achieve and maintain adequate cardiovascular perfusion will improve Outcome: Progressing   Problem: Health Behavior/Discharge Planning: Goal: Ability to safely manage health-related needs after discharge will improve Outcome: Progressing   Problem: Fluid Volume: Goal: Hemodynamic stability will improve Outcome: Progressing   Problem: Clinical Measurements: Goal: Diagnostic test results will improve Outcome: Progressing Goal: Signs and symptoms of infection will decrease Outcome: Progressing   Problem: Respiratory: Goal: Ability to maintain adequate ventilation will improve Outcome: Progressing   Problem: Education: Goal: Knowledge of General Education information will improve Description: Including pain rating scale, medication(s)/side effects and non-pharmacologic comfort measures Outcome: Progressing   Problem: Health Behavior/Discharge Planning: Goal: Ability to manage health-related needs will improve Outcome: Progressing   Problem: Clinical Measurements: Goal: Ability to maintain clinical measurements within normal limits will improve Outcome: Progressing Goal: Will remain free from infection Outcome: Progressing Goal: Diagnostic test results will improve Outcome: Progressing Goal: Respiratory complications will improve Outcome: Progressing Goal: Cardiovascular complication will be avoided Outcome: Progressing   Problem: Activity: Goal: Risk for activity intolerance will decrease Outcome: Progressing   Problem: Nutrition: Goal: Adequate nutrition will be maintained Outcome: Progressing   Problem: Coping: Goal: Level of  anxiety will decrease Outcome: Progressing   Problem: Elimination: Goal: Will not experience complications related to bowel motility Outcome: Progressing Goal: Will not experience complications related to urinary retention Outcome: Progressing   Problem: Pain Managment: Goal: General experience of comfort will improve and/or be controlled Outcome: Progressing   Problem: Safety: Goal: Ability to remain free from injury will improve Outcome: Progressing   Problem: Skin Integrity: Goal: Risk for impaired skin integrity will decrease Outcome: Progressing

## 2023-09-04 NOTE — Discharge Summary (Signed)
 Physician Discharge Summary   Patient: Robin Ochoa MRN: [id number] DOB: Sep 01, 1955  Admit date:     09/02/2023  Discharge date: 09/04/23  Discharge Physician: Rawleigh Rode   PCP: Mai Schwalbe, FNP   Recommendations at discharge:   Take medications as recommended Keep scheduled follow-up appointment with primary care provider and cardiology as an outpatient Return to the emergency room for worsening symptoms  Discharge Diagnoses: Principal Problem:   SIRS (systemic inflammatory response syndrome) (HCC) Active Problems:   Respiratory tract infection-acute bronchitis   Lactic acidosis   Nausea and vomiting   Elevated troponin   Chronic bilateral low back pain with bilateral sciatica   Depression   Sepsis (HCC)  Resolved Problems:   * No resolved hospital problems. *  Hospital Course: Robin Ochoa Harcum is a 68 y.o. female with medical history significant for Pelvic fracture 2014, lumbar laminectomy 2020, HTN, depression, who presented by EMS with chills, nonbloody, nonbilious vomiting starting a couple hours prior to arrival.  She had a salad from a restaurant prior to the onset and wonders if she got food poisoning.  She denies abdominal pain or diarrhea but stated that a few days prior she had a bad case of acid reflux.  Additionally, she has 2-Dowse history of congestion, nonproductive cough and hoarseness.  She denies fever. Denies chest pain.   Denies dysuria.  Denies back or neck pain.  With EMS she was tachycardic to 126 and normothermic with O2 sat 88% on room air and required 5 L to get sats to the mid 90s. ED course and data review: On arrival temp of 101.3 and tachycardic to 124, tachypneic to 24, soft BP  103/52.  O2 sat on 4 L 97%. Notable labs include normal WBC but with lactic acid 2.4--> 2.8.  Procalcitonin <0.10. Troponin 53--> 188, BNP 25 Hemoglobin normal Creatinine 1.07 Glucose 173 Urinalysis with trace leukocytes Respiratory viral panel negative for COVID flu and  RSV   EKG, personally reviewed and interpreted showing sinus at 96 Chest x-ray showing bronchitis/reactive airways   Patient treated with LR bolus and started on cefepime , metronidazole  and Flagyl  for sepsis of unknown source   Hospitalist consulted for admission.    Assessment and Plan:  Respiratory tract infection-acute bronchitis Acute respiratory failure with hypoxia SIRS, possible sepsis with lactic acidosis Sepsis criteria on admission included Fever and tachycardia with tachypnea, hypoxia, soft BP and lactic acidosis.  WBC normal and procalcitonin<0.1 Lactic acid 2.2--> 2.8--> 2.5 Respiratory viral panel was negative CT angiogram of the chest was negative for PE but showed diffuse bronchial wall thickening greatest in the lower lobes, compatible with bronchitis. She has been afebrile over the last 24 hours and tachycardia and tachypnea have resolved She was ambulated in the hall and pulse oximetry post ambulation was 92 - 94% Patient received IV fluids and antibiotic therapy She will be discharged home on Z-Pak           Nausea and vomiting History of chronic gastritis on EGD 2023 Resolved Uncertain etiology, possibly food poisoning.   Supportive care with PPI, antiemetics       Elevated troponin Myocardial injury Suspect demand ischemia from tachycardia with heart rate in the 120s on admission due to respiratory infection Troponin 53-->188--->416,  Denies chest pain and with nonacute EKG Coronary calcium score of 0 on CTA coronary 01/2022 Appreciate cardiology input, will discontinue IV heparin  2D echocardiogram showed a normal LVEF of 60 to 65% with grade 1 diastolic dysfunction and no  evidence of regional wall motion abnormality. Ischemic evaluation is not planned at this time an outpatient stress test may be considered on discharge. Patient to follow-up with cardiology on discharge Atorvastatin 40 mg daily was added to her home medications     Chronic  bilateral low back pain with bilateral sciatica History of lumbar laminectomy 2020 History of pelvic fracture 2014 Continue oxycodone as needed     Depression Continue home bupropion and Zoloft      Obesity BMI 33 Complicates overall prognosis and care Lifestyle modification and exercise has been discussed with patient.        Patient was seen and examined at the bedside and is in stable condition for discharge      Consultants: Cardiology Procedures performed: 2D echocardiogram Disposition: Home Diet recommendation:  Discharge Diet Orders (From admission, onward)     Start     Ordered   09/04/23 0000  Diet - low sodium heart healthy        09/04/23 1038           Cardiac diet DISCHARGE MEDICATION: Allergies as of 09/04/2023       Reactions   Ibuprofen Other (See Comments)   Causes heartburn, burning in her throat.   Penicillins Rash        Medication List     STOP taking these medications    methocarbamol 750 MG tablet Commonly known as: ROBAXIN   naloxone 4 MG/0.1ML Liqd nasal spray kit Commonly known as: NARCAN   tiZANidine 4 MG tablet Commonly known as: ZANAFLEX       TAKE these medications    aspirin EC 81 MG tablet Take 1 tablet by mouth daily.   atorvastatin 40 MG tablet Commonly known as: LIPITOR Take 1 tablet (40 mg total) by mouth daily. Start taking on: Sep 05, 2023   azithromycin  250 MG tablet Commonly known as: Zithromax  Z-Pak Take 2 tablets (500 mg) on  Simmers 1,  followed by 1 tablet (250 mg) once daily on Days 2 through 5.   buPROPion 150 MG 24 hr tablet Commonly known as: WELLBUTRIN XL Take 1 tablet by mouth daily.   celecoxib 100 MG capsule Commonly known as: CELEBREX Take 100 mg by mouth daily as needed for mild pain (pain score 1-3).   famotidine 20 MG tablet Commonly known as: PEPCID Take 20 mg by mouth 2 (two) times daily as needed (acid reflux).   guaiFENesin 600 MG 12 hr tablet Commonly known as:  Mucinex Take 1 tablet (600 mg total) by mouth 2 (two) times daily for 10 days.   hydrochlorothiazide  25 MG tablet Commonly known as: HYDRODIURIL  Take 1 tablet (25 mg total) by mouth daily as needed.   HYDROcodone-acetaminophen  7.5-325 MG tablet Commonly known as: NORCO Take 1 tablet by mouth 3 (three) times daily as needed. What changed: Another medication with the same name was removed. Continue taking this medication, and follow the directions you see here.   lidocaine  5 % Commonly known as: LIDODERM  Place onto the skin.   omeprazole 40 MG capsule Commonly known as: PRILOSEC Take 40 mg by mouth 2 (two) times daily. What changed: Another medication with the same name was removed. Continue taking this medication, and follow the directions you see here.   pregabalin 100 MG capsule Commonly known as: LYRICA Take 100 mg by mouth 2 (two) times daily.   sertraline  100 MG tablet Commonly known as: ZOLOFT  Take 150 mg by mouth daily.  Follow-up Information     Devorah Fonder, MD Follow up in 2 week(s).   Specialty: Cardiology Contact information: [address] [place] [address] [phone]         Mai Schwalbe, FNP Follow up in 1 week(s).   Specialty: Nurse Practitioner Contact information: [address] [place] [address] [phone]                Discharge Exam: Filed Weights   09/02/23 2045  Weight: 85 kg     Vitals and nursing note reviewed.  Constitutional:      General: She is not in acute distress.  Obese HENT:     Head: Normocephalic and atraumatic.  Cardiovascular:     Rate and Rhythm: Regular rhythm.     Heart sounds: Normal heart sounds.  Pulmonary:     Effort: Bilateral air entry    Breath sounds: Normal breath sounds.  Abdominal:     Palpations: Abdomen is soft.     Tenderness: There is no abdominal tenderness.  Neurological:     Mental Status: Mental status is at baseline.       Condition at  discharge: stable  The results of significant diagnostics from this hospitalization (including imaging, microbiology, ancillary and laboratory) are listed below for reference.   Imaging Studies: ECHOCARDIOGRAM COMPLETE Result Date: 09/03/2023    ECHOCARDIOGRAM REPORT   Patient Name:   NELSA KUTZLER Matin Date of Exam: 09/03/2023 Medical Rec #:  [id number]   Height:       63.0 in Accession #:    [id number]  Weight:       187.4 lb Date of Birth:  [birth year]   BSA:          1.881 m Patient Age:    68 years    BP:           128/109 mmHg Patient Gender: F           HR:           79 bpm. Exam Location:  [facility] Procedure: 2D Echo, Cardiac Doppler and Color Doppler (Both Spectral and Color            Flow Doppler were utilized during procedure). Indications:     Elevated Troponin  History:         Patient has prior history of Echocardiogram examinations, most                  recent 02/18/2022. Migraine; Risk Factors:Dyslipidemia.  Sonographer:     Terrilee Few RCS Referring Phys:  [id number] Lanetta Pion Diagnosing Phys: Constancia Delton MD IMPRESSIONS  1. Left ventricular ejection fraction, by estimation, is 60 to 65%. The left ventricle has normal function. The left ventricle has no regional wall motion abnormalities. Left ventricular diastolic parameters are consistent with Grade I diastolic dysfunction (impaired relaxation).  2. Right ventricular systolic function is normal. The right ventricular size is normal.  3. The mitral valve is normal in structure. No evidence of mitral valve regurgitation.  4. The aortic valve is tricuspid. Aortic valve regurgitation is not visualized.  5. The inferior vena cava is dilated in size with >50% respiratory variability, suggesting right atrial pressure of 8 mmHg. FINDINGS  Left Ventricle: Left ventricular ejection fraction, by estimation, is 60 to 65%. The left ventricle has normal function. The left ventricle has no regional wall motion abnormalities. The left ventricular internal  cavity size was normal in size. There is  no left ventricular hypertrophy. Left ventricular diastolic parameters are consistent with Grade I diastolic dysfunction (impaired relaxation). Right Ventricle: The right ventricular size is normal. No increase in right ventricular wall thickness. Right ventricular systolic function is normal. Left Atrium: Left atrial size was normal in size. Right Atrium: Right atrial size was normal in size. Pericardium: There is no evidence of pericardial effusion. Mitral Valve: The mitral valve is normal in structure. No evidence of mitral valve regurgitation. Tricuspid Valve: The tricuspid valve is normal in structure. Tricuspid valve regurgitation is not demonstrated. Aortic Valve: The aortic valve is tricuspid. Aortic valve regurgitation is not visualized. Aortic valve peak gradient measures 6.5 mmHg. Pulmonic Valve: The pulmonic valve was normal in structure. Pulmonic valve regurgitation is not visualized. Aorta: The aortic root and ascending aorta are structurally normal, with no evidence of dilitation. Venous: The inferior vena cava is dilated in size with greater than 50% respiratory variability, suggesting right atrial pressure of 8 mmHg. IAS/Shunts: No atrial level shunt detected by color flow Doppler.  LEFT VENTRICLE PLAX 2D LVIDd:         4.50 cm   Diastology LVIDs:         3.10 cm   LV e' medial:    6.96 cm/s LV PW:         1.10 cm   LV E/e' medial:  9.2 LV IVS:        1.00 cm   LV e' lateral:   12.40 cm/s LVOT diam:     2.20 cm   LV E/e' lateral: 5.2 LV SV:         68 LV SV Index:   36 LVOT Area:     3.80 cm  RIGHT VENTRICLE             IVC RV S prime:     10.70 cm/s  IVC diam: 2.20 cm TAPSE (M-mode): 1.8 cm LEFT ATRIUM             Index        RIGHT ATRIUM           Index LA diam:        3.80 cm 2.02 cm/m   RA Area:     15.73 cm LA Vol (A2C):   42.1 ml 22.38 ml/m  RA Volume:   39.33 ml  20.91 ml/m LA Vol (A4C):   69.5 ml 36.95 ml/m LA Biplane Vol: 57.5 ml 30.57 ml/m   AORTIC VALVE AV Area (Vmax): 2.81 cm AV Vmax:        127.00 cm/s AV Peak Grad:   6.5 mmHg LVOT Vmax:      93.80 cm/s LVOT Vmean:     58.100 cm/s LVOT VTI:       0.179 m  AORTA Ao Root diam: 3.10 cm Ao Asc diam:  3.30 cm MITRAL VALVE MV Area (PHT): 3.68 cm    SHUNTS MV Decel Time: 206 msec    Systemic VTI:  0.18 m MV E velocity: 63.90 cm/s  Systemic Diam: 2.20 cm MV A velocity: 79.23 cm/s MV E/A ratio:  0.81 Constancia Delton MD Electronically signed by Constancia Delton MD Signature Date/Time: 09/03/2023/5:27:51 PM    Final    CT Angio Chest Pulmonary Embolism (PE) W or WO Contrast Result Date: 09/03/2023 CLINICAL DATA:  Chills, nausea, vomiting for 1-1/2 hours.  Headache. EXAM: CT ANGIOGRAPHY CHEST WITH CONTRAST TECHNIQUE: Multidetector CT imaging of the chest was performed using the standard protocol during bolus administration of intravenous  contrast. Multiplanar CT image reconstructions and MIPs were obtained to evaluate the vascular anatomy. RADIATION DOSE REDUCTION: This exam was performed according to the departmental dose-optimization program which includes automated exposure control, adjustment of the mA and/or kV according to patient size and/or use of iterative reconstruction technique. CONTRAST:  OMNIPAQUE IOHEXOL 350 MG/ML SOLN COMPARISON:  Chest radiograph 09/02/2023 and cardiac CT 02/18/2022 FINDINGS: Cardiovascular: No pericardial effusion. Normal caliber thoracic aorta. Negative for acute pulmonary embolism. Mediastinum/Nodes: Bowing of the posterior trachea compatible with expiratory phase imaging. Esophagus is unremarkable. No thoracic adenopathy. Lungs/Pleura: Diffuse bronchial wall thickening greatest in the lower lobes. No focal consolidation, pleural effusion, or pneumothorax. Upper Abdomen: No acute abnormality. Musculoskeletal: No acute fracture. Review of the MIP images confirms the above findings. IMPRESSION: 1. Negative for acute pulmonary embolism. 2. Diffuse bronchial wall  thickening greatest in the lower lobes, compatible with bronchitis. Electronically Signed   By: Rozell Cornet M.D.   On: 09/03/2023 00:52   DG Chest Port 1 View Result Date: 09/02/2023 CLINICAL DATA:  Chills, nausea, vomiting.  Questionable sepsis EXAM: PORTABLE CHEST 1 VIEW COMPARISON:  04/06/2018 FINDINGS: Peribronchial thickening in the lower lungs. No focal consolidation, pneumothorax or pleural effusion. Normal cardiomediastinal silhouette. No acute bone abnormality. IMPRESSION: Bronchitis/reactive airways. Electronically Signed   By: Rozell Cornet M.D.   On: 09/02/2023 21:27    Microbiology: Results for orders placed or performed during the hospital encounter of 09/02/23  Resp panel by RT-PCR (RSV, Flu A&B, Covid) Anterior Nasal Swab     Status: None   Collection Time: 09/02/23  8:57 PM   Specimen: Anterior Nasal Swab  Result Value Ref Range Status   SARS Coronavirus 2 by RT PCR NEGATIVE NEGATIVE Final    Comment: (NOTE) SARS-CoV-2 target nucleic acids are NOT DETECTED.  The SARS-CoV-2 RNA is generally detectable in upper respiratory specimens during the acute phase of infection. The lowest concentration of SARS-CoV-2 viral copies this assay can detect is 138 copies/mL. A negative result does not preclude SARS-Cov-2 infection and should not be used as the sole basis for treatment or other patient management decisions. A negative result may occur with  improper specimen collection/handling, submission of specimen other than nasopharyngeal swab, presence of viral mutation(s) within the areas targeted by this assay, and inadequate number of viral copies(<138 copies/mL). A negative result must be combined with clinical observations, patient history, and epidemiological information. The expected result is Negative.  Fact Sheet for Patients:  BloggerCourse.com  Fact Sheet for Healthcare Providers:  SeriousBroker.it  This test is  no t yet approved or cleared by the [place]  FDA and  has been authorized for detection and/or diagnosis of SARS-CoV-2 by FDA under an Emergency Use Authorization (EUA). This EUA will remain  in effect (meaning this test can be used) for the duration of the COVID-19 declaration under Section 564(b)(1) of the Act, 21 U.S.C.section 360bbb-3(b)(1), unless the authorization is terminated  or revoked sooner.       Influenza A by PCR NEGATIVE NEGATIVE Final   Influenza B by PCR NEGATIVE NEGATIVE Final    Comment: (NOTE) The Xpert Xpress SARS-CoV-2/FLU/RSV plus assay is intended as an aid in the diagnosis of influenza from Nasopharyngeal swab specimens and should not be used as a sole basis for treatment. Nasal washings and aspirates are unacceptable for Xpert Xpress SARS-CoV-2/FLU/RSV testing.  Fact Sheet for Patients: BloggerCourse.com  Fact Sheet for Healthcare Providers: SeriousBroker.it  This test is not yet approved or cleared by the [place]   FDA and has been authorized for detection and/or diagnosis of SARS-CoV-2 by FDA under an Emergency Use Authorization (EUA). This EUA will remain in effect (meaning this test can be used) for the duration of the COVID-19 declaration under Section 564(b)(1) of the Act, 21 U.S.C. section 360bbb-3(b)(1), unless the authorization is terminated or revoked.     Resp Syncytial Virus by PCR NEGATIVE NEGATIVE Final    Comment: (NOTE) Fact Sheet for Patients: BloggerCourse.com  Fact Sheet for Healthcare Providers: SeriousBroker.it  This test is not yet approved or cleared by the [place]  FDA and has been authorized for detection and/or diagnosis of SARS-CoV-2 by FDA under an Emergency Use Authorization (EUA). This EUA will remain in effect (meaning this test can be used) for the duration of the COVID-19 declaration under Section  564(b)(1) of the Act, 21 U.S.C. section 360bbb-3(b)(1), unless the authorization is terminated or revoked.  Performed at [facility], [address] [place], [place] [address]   Blood Culture (routine x 2)     Status: None (Preliminary result)   Collection Time: 09/02/23  8:57 PM   Specimen: BLOOD  Result Value Ref Range Status   Specimen Description BLOOD BLOOD LEFT ARM  Final   Special Requests   Final    BOTTLES DRAWN AEROBIC AND ANAEROBIC Blood Culture adequate volume   Culture   Final    NO GROWTH 2 DAYS Performed at [facility], [address], [place], [place] [address]    Report Status PENDING  Incomplete  Blood Culture (routine x 2)     Status: None (Preliminary result)   Collection Time: 09/02/23  8:57 PM   Specimen: BLOOD  Result Value Ref Range Status   Specimen Description BLOOD BLOOD RIGHT ARM  Final   Special Requests   Final    BOTTLES DRAWN AEROBIC AND ANAEROBIC Blood Culture adequate volume   Culture   Final    NO GROWTH 2 DAYS Performed at [facility], [address] [place], [place] [address]    Report Status PENDING  Incomplete  Urine Culture     Status: Abnormal   Collection Time: 09/02/23  8:57 PM   Specimen: Urine, Clean Catch  Result Value Ref Range Status   Specimen Description   Final    URINE, CLEAN CATCH Performed at [facility], [address], [place], [place] [address]    Special Requests   Final    NONE Performed at [facility], [address] [place], [place] [address]    Culture MULTIPLE SPECIES PRESENT, SUGGEST RECOLLECTION (A)  Final   Report Status 09/04/2023 FINAL  Final    Labs: CBC: Recent Labs  Lab 09/02/23 2101 09/04/23 0516  WBC 6.5 5.8  NEUTROABS 6.0  --   HGB 12.5 12.6  HCT 37.2 37.8  MCV 96.6 97.7  PLT 209 179   Basic Metabolic Panel: Recent Labs  Lab 09/02/23 2101 09/04/23 0516  NA 139 142  K 3.7 3.9  CL 108 109  CO2 22 27  GLUCOSE 173* 118*   BUN 21 13  CREATININE 1.07* 0.96  CALCIUM 8.8* 8.9   Liver Function Tests: Recent Labs  Lab 09/02/23 2101  AST 24  ALT 18  ALKPHOS 85  BILITOT 0.4  PROT 6.1*  ALBUMIN 3.3*   CBG: No results for input(s): "GLUCAP" in the last 168 hours.  Discharge time spent: greater than 30 minutes.  Signed: Read Camel, MD [facility] 09/04/2023

## 2023-09-04 NOTE — Progress Notes (Addendum)
 Cardiology Progress Note   Patient Name: Robin Ochoa Date of Encounter: 09/04/2023  Primary Cardiologist: Jacquez Sheetz, MD  Subjective   Breathing much improved.  No chest pain. Objective   Inpatient Medications    Scheduled Meds:  aspirin EC  81 mg Oral Daily   atorvastatin  40 mg Oral Daily   enoxaparin (LOVENOX) injection  40 mg Subcutaneous Q24H   pantoprazole (PROTONIX) IV  40 mg Intravenous Q24H   sertraline   100 mg Oral Daily   Continuous Infusions:  azithromycin  500 mg (09/03/23 2220)   cefTRIAXone (ROCEPHIN)  IV 2 g (09/04/23 0653)   PRN Meds: acetaminophen , guaiFENesin, HYDROcodone-acetaminophen , ondansetron  (ZOFRAN ) IV, tiZANidine   Vital Signs    Vitals:   09/03/23 1612 09/03/23 2158 09/04/23 0430 09/04/23 0750  BP: (!) 105/44 124/61 130/77 127/62  Pulse: 78 74 80 75  Resp:      Temp: 98.7 F (37.1 C) 98.3 F (36.8 C) 98.9 F (37.2 C) 98.5 F (36.9 C)  TempSrc:  Oral    SpO2: 97% 98% 97% 95%  Weight:      Height:        Intake/Output Summary (Last 24 hours) at 09/04/2023 0950 Last data filed at 09/03/2023 1917 Gross per 24 hour  Intake 240 ml  Output --  Net 240 ml   Filed Weights   09/02/23 2045  Weight: 85 kg    Physical Exam   GEN: Well nourished, well developed, in no acute distress.  HEENT: Grossly normal.  Neck: Supple, no JVD, carotid bruits, or masses. Cardiac: RRR, no murmurs, rubs, or gallops. No clubbing, cyanosis, edema.  Radials 2+, DP/PT 2+ and equal bilaterally.  Respiratory:  Respirations regular and unlabored, clear to auscultation bilaterally. GI: Soft, nontender, nondistended, BS + x 4. MS: no deformity or atrophy. Skin: warm and dry, no rash. Neuro:  Strength and sensation are intact. Psych: AAOx3.  Normal affect.  Labs    Chemistry Recent Labs  Lab 09/02/23 2101 09/04/23 0516  NA 139 142  K 3.7 3.9  CL 108 109  CO2 22 27  GLUCOSE 173* 118*  BUN 21 13  CREATININE 1.07* 0.96  CALCIUM 8.8* 8.9  PROT  6.1*  --   ALBUMIN 3.3*  --   AST 24  --   ALT 18  --   ALKPHOS 85  --   BILITOT 0.4  --   GFRNONAA 56* >60  ANIONGAP 9 6     Hematology Recent Labs  Lab 09/02/23 2101 09/04/23 0516  WBC 6.5 5.8  RBC 3.85* 3.87  HGB 12.5 12.6  HCT 37.2 37.8  MCV 96.6 97.7  MCH 32.5 32.6  MCHC 33.6 33.3  RDW 12.6 12.7  PLT 209 179    Cardiac Enzymes  Recent Labs  Lab 09/02/23 2101 09/02/23 2228 09/03/23 0145 09/03/23 0643  TROPONINIHS 53* 188* 416* 255*      BNP    Component Value Date/Time   BNP 25.6 09/02/2023 2057    DDimer  Recent Labs  Lab 09/02/23 2337  DDIMER 1.16*     Lipids  Lab Results  Component Value Date   CHOL 145 09/04/2023   HDL 42 09/04/2023   LDLCALC 77 09/04/2023   TRIG 131 09/04/2023   CHOLHDL 3.5 09/04/2023    HbA1c  Lab Results  Component Value Date   HGBA1C 5.6 03/26/2016    Radiology    CT Angio Chest Pulmonary Embolism (PE) W or WO Contrast Result Date: 09/03/2023 CLINICAL  DATA:  Chills, nausea, vomiting for 1-1/2 hours.  Headache. EXAM: CT ANGIOGRAPHY CHEST WITH CONTRAST TECHNIQUE: Multidetector CT imaging of the chest was performed using the standard protocol during bolus administration of intravenous contrast. Multiplanar CT image reconstructions and MIPs were obtained to evaluate the vascular anatomy. RADIATION DOSE REDUCTION: This exam was performed according to the departmental dose-optimization program which includes automated exposure control, adjustment of the mA and/or kV according to patient size and/or use of iterative reconstruction technique. CONTRAST:  OMNIPAQUE IOHEXOL 350 MG/ML SOLN COMPARISON:  Chest radiograph 09/02/2023 and cardiac CT 02/18/2022 FINDINGS: Cardiovascular: No pericardial effusion. Normal caliber thoracic aorta. Negative for acute pulmonary embolism. Mediastinum/Nodes: Bowing of the posterior trachea compatible with expiratory phase imaging. Esophagus is unremarkable. No thoracic adenopathy.  Lungs/Pleura: Diffuse bronchial wall thickening greatest in the lower lobes. No focal consolidation, pleural effusion, or pneumothorax. Upper Abdomen: No acute abnormality. Musculoskeletal: No acute fracture. Review of the MIP images confirms the above findings. IMPRESSION: 1. Negative for acute pulmonary embolism. 2. Diffuse bronchial wall thickening greatest in the lower lobes, compatible with bronchitis. Electronically Signed   By: Rozell Cornet M.D.   On: 09/03/2023 00:52   DG Chest Port 1 View Result Date: 09/02/2023 CLINICAL DATA:  Chills, nausea, vomiting.  Questionable sepsis EXAM: PORTABLE CHEST 1 VIEW COMPARISON:  04/06/2018 FINDINGS: Peribronchial thickening in the lower lungs. No focal consolidation, pneumothorax or pleural effusion. Normal cardiomediastinal silhouette. No acute bone abnormality. IMPRESSION: Bronchitis/reactive airways. Electronically Signed   By: Rozell Cornet M.D.   On: 09/02/2023 21:27     Telemetry    RSR, 60's to 70's - Personally Reviewed  Cardiac Studies   2D Echocardiogram 5.9.2025   1. Left ventricular ejection fraction, by estimation, is 60 to 65%. The  left ventricle has normal function. The left ventricle has no regional  wall motion abnormalities. Left ventricular diastolic parameters are  consistent with Grade I diastolic  dysfunction (impaired relaxation).   2. Right ventricular systolic function is normal. The right ventricular  size is normal.   3. The mitral valve is normal in structure. No evidence of mitral valve  regurgitation.   4. The aortic valve is tricuspid. Aortic valve regurgitation is not  visualized.   5. The inferior vena cava is dilated in size with >50% respiratory  variability, suggesting right atrial pressure of 8 mmHg.  _____________   Patient Profile     68 y.o. female with a history of mitral regurgitation, primary hypertension, pelvic fracture in 2014, degenerative disc disease status post lumbar laminectomy 2022,  and depression, admitted 5/8 with bronchitis/SIRS, and noted to have troponin elevation (peak 416).  Assessment & Plan    1. Demand Ischemia:  H/o calcium score of 0 in 01/2022.  Admitted 5/8 w/ bronchitis, resp failure/hypoxia, tachycardia, relative hypotension, SIRS.  CTA chest neg for PE but showed bronchitis.  HsTrop up to 416.  No chest pain.  Echo w/ nl EF and w/o rwma or significant valvular dzs.  Suspect demand ischemia.  Rec conservative rx at this time w/ asa/statin.  F/u as outpt after recovery from resp infection and we will likely pursue cor CTA.  2.  Acute resp failure/bronchitis/SIRS:  improved.  Hemodynamically stable.  Abx per IM.  3.  Primary HTN:  Stable.  Off of home dose of hydrochlorothiazide  at this time.  4.  Lipids: LDL 77 - statin naive.  Atorva added in setting of demand ischemia.  F/u lipids as outpt.  Signed, Laneta Pintos, NP  09/04/2023, 9:50 AM    For questions or updates, please contact   Please consult [email or web address] for contact info under Cardiology/STEMI.   Attending Note Patient seen and examined, agree with detailed note above,   Patient presentation and plan discussed on rounds.     EKG lab work, chest x-ray, echocardiogram reviewed independently by myself  On rounds this morning reports feeling better, rigors have resolved Discussed events leading to admission, reports chills fever rigors at home with general malaise Cardiology consulted for elevated troponin peak 416 Denies significant chest pain through her hospitalization concerning for angina Husband at the bedside  On examination : alert oriented, no JVD, lungs clear to auscultation bilaterally, heart sounds regular normal S1-S2 no murmurs appreciated, abdomen soft nontender no significant lower extremity edema.  Musculoskeletal exam with good range of motion, neurologic exam grossly nonfocal  CBC    Component Value Date/Time   WBC 5.8 09/04/2023 0516   RBC 3.87 09/04/2023 0516    HGB 12.6 09/04/2023 0516   HCT 37.8 09/04/2023 0516   PLT 179 09/04/2023 0516   MCV 97.7 09/04/2023 0516   MCH 32.6 09/04/2023 0516   MCHC 33.3 09/04/2023 0516   RDW 12.7 09/04/2023 0516   LYMPHSABS 0.4 (L) 09/02/2023 2101   MONOABS 0.1 09/02/2023 2101   EOSABS 0.1 09/02/2023 2101   BASOSABS 0.0 09/02/2023 2101      Latest Ref Rng & Units 09/04/2023    5:16 AM 09/02/2023    9:01 PM 03/05/2019    3:53 PM  BMP  Glucose 70 - 99 mg/dL 604  540  981   BUN 8 - 23 mg/dL 13  21  14    Creatinine 0.44 - 1.00 mg/dL 1.91  4.78  2.95   Sodium 135 - 145 mmol/L 142  139  139   Potassium 3.5 - 5.1 mmol/L 3.9  3.7  3.2   Chloride 98 - 111 mmol/L 109  108  108   CO2 22 - 32 mmol/L 27  22  22    Calcium 8.9 - 10.3 mg/dL 8.9  8.8  9.9    A/P: SIRS/URI with acute bronchitis - Presenting with chills fever rigors, general malaise, congestion in the chest - Feels symptoms are improving Afebrile, has received antibiotics Respiratory panel negative  Elevated troponin Likely demand ischemia in the setting of sepsis/ SIRS above with rigors and chills Peak troponin 416, Echo with normal LV function no regional wall motion abnormality -Continue aspirin, statin, consider outpatient Myoview or cardiac CTA after infection has resolved  Hyperlipidemia Started on statin  Hypertension Off hydrochlorothiazide  Consider restart as an outpatient  Signed: Juanda Noon  M.D., Ph.D. [facility]

## 2023-09-07 LAB — CULTURE, BLOOD (ROUTINE X 2)
Culture: NO GROWTH
Culture: NO GROWTH
Special Requests: ADEQUATE
Special Requests: ADEQUATE

## 2023-09-08 DIAGNOSIS — M19012 Primary osteoarthritis, left shoulder: Secondary | ICD-10-CM | POA: Diagnosis not present

## 2023-09-08 DIAGNOSIS — Z79891 Long term (current) use of opiate analgesic: Secondary | ICD-10-CM | POA: Diagnosis not present

## 2023-09-08 DIAGNOSIS — M961 Postlaminectomy syndrome, not elsewhere classified: Secondary | ICD-10-CM | POA: Diagnosis not present

## 2023-09-08 DIAGNOSIS — Z5181 Encounter for therapeutic drug level monitoring: Secondary | ICD-10-CM | POA: Diagnosis not present

## 2023-09-08 DIAGNOSIS — M47816 Spondylosis without myelopathy or radiculopathy, lumbar region: Secondary | ICD-10-CM | POA: Diagnosis not present

## 2023-09-08 DIAGNOSIS — M4726 Other spondylosis with radiculopathy, lumbar region: Secondary | ICD-10-CM | POA: Diagnosis not present

## 2023-09-08 DIAGNOSIS — M5416 Radiculopathy, lumbar region: Secondary | ICD-10-CM | POA: Diagnosis not present

## 2023-09-08 DIAGNOSIS — G8929 Other chronic pain: Secondary | ICD-10-CM | POA: Diagnosis not present

## 2023-09-08 DIAGNOSIS — G894 Chronic pain syndrome: Secondary | ICD-10-CM | POA: Diagnosis not present

## 2023-09-08 DIAGNOSIS — M25512 Pain in left shoulder: Secondary | ICD-10-CM | POA: Diagnosis not present

## 2023-09-16 DIAGNOSIS — K219 Gastro-esophageal reflux disease without esophagitis: Secondary | ICD-10-CM | POA: Diagnosis not present

## 2023-09-16 DIAGNOSIS — F3289 Other specified depressive episodes: Secondary | ICD-10-CM | POA: Diagnosis not present

## 2023-09-24 ENCOUNTER — Encounter: Payer: Self-pay | Admitting: Internal Medicine

## 2023-09-24 ENCOUNTER — Ambulatory Visit: Admitting: Internal Medicine

## 2023-09-24 VITALS — BP 124/74 | Ht 63.0 in | Wt 208.6 lb

## 2023-09-24 DIAGNOSIS — E6609 Other obesity due to excess calories: Secondary | ICD-10-CM | POA: Insufficient documentation

## 2023-09-24 DIAGNOSIS — G8929 Other chronic pain: Secondary | ICD-10-CM

## 2023-09-24 DIAGNOSIS — E559 Vitamin D deficiency, unspecified: Secondary | ICD-10-CM | POA: Diagnosis not present

## 2023-09-24 DIAGNOSIS — Z87442 Personal history of urinary calculi: Secondary | ICD-10-CM

## 2023-09-24 DIAGNOSIS — R5383 Other fatigue: Secondary | ICD-10-CM | POA: Diagnosis not present

## 2023-09-24 DIAGNOSIS — E782 Mixed hyperlipidemia: Secondary | ICD-10-CM

## 2023-09-24 DIAGNOSIS — M810 Age-related osteoporosis without current pathological fracture: Secondary | ICD-10-CM

## 2023-09-24 DIAGNOSIS — K760 Fatty (change of) liver, not elsewhere classified: Secondary | ICD-10-CM

## 2023-09-24 DIAGNOSIS — M5442 Lumbago with sciatica, left side: Secondary | ICD-10-CM

## 2023-09-24 DIAGNOSIS — E538 Deficiency of other specified B group vitamins: Secondary | ICD-10-CM

## 2023-09-24 DIAGNOSIS — M5441 Lumbago with sciatica, right side: Secondary | ICD-10-CM

## 2023-09-24 DIAGNOSIS — F3342 Major depressive disorder, recurrent, in full remission: Secondary | ICD-10-CM

## 2023-09-24 DIAGNOSIS — G43019 Migraine without aura, intractable, without status migrainosus: Secondary | ICD-10-CM

## 2023-09-24 DIAGNOSIS — K219 Gastro-esophageal reflux disease without esophagitis: Secondary | ICD-10-CM

## 2023-09-24 DIAGNOSIS — E66812 Obesity, class 2: Secondary | ICD-10-CM

## 2023-09-24 DIAGNOSIS — M85852 Other specified disorders of bone density and structure, left thigh: Secondary | ICD-10-CM | POA: Diagnosis not present

## 2023-09-24 DIAGNOSIS — R7303 Prediabetes: Secondary | ICD-10-CM | POA: Diagnosis not present

## 2023-09-24 NOTE — Assessment & Plan Note (Signed)
 Encouraged diet and exercise for weight loss ?

## 2023-09-24 NOTE — Assessment & Plan Note (Signed)
 Avoid triggers Continue hydrocodone  as needed but avoid overuse as this can lead to rebound headaches.

## 2023-09-24 NOTE — Assessment & Plan Note (Signed)
 CMET today Encouraged weight loss

## 2023-09-24 NOTE — Assessment & Plan Note (Signed)
A1C today Encouraged her to consume a low fat diet

## 2023-09-24 NOTE — Patient Instructions (Signed)

## 2023-09-24 NOTE — Assessment & Plan Note (Signed)
No reoccurrence  Will monitor 

## 2023-09-24 NOTE — Assessment & Plan Note (Signed)
 Continue sertraline  and buproprion Support offered

## 2023-09-24 NOTE — Progress Notes (Signed)
 Subjective:    Patient ID: Robin Ochoa, female    DOB: 1955-10-24, 68 y.o.   MRN: [id number]  HPI  Patient presents to clinic today to establish care and for management of the conditions listed below.  Depression: Chronic, managed on bupropion and sertraline .  She is seeing a therapist.  She denies anxiety, SI/HI.  Migraines: These occur rarely.  Triggered by stress.  She takes hydrocodone  as needed with some relief of symptoms.  She does not follow with neurology.  GERD: Triggered by spicy foods.  She denies breakthrough on omeprazole and famotidine.  Upper GI from 10/2021 reviewed.  HLD: Her last LDL was 77, triglycerides 366, 08/2023.  She is not taking atorvastatin  as prescribed.  She tries to consume a low-fat diet.  Chronic low back pain with sciatica: s/p surgical intervention. Managed with hydrocodone  and pregabalin.  She follows with pain management.  Prediabetes: Her last A1c was 5.7, 10/2020.  She is not taking any oral diabetic medication at this time.  She does not check her sugars.  Osteoporosis: Bone density from 01/2023 reviewed (care everywhere) She is not taking any calcium  or vitamin D  OTC. Someone has suggested she start reclast but she is still considering. She tries to get some weightbearing exercise.  History of kidney stones: Remote history. She has not had any issues with this.   NAFLD: She does not drink alcohol. CT from 01/2019 reviewed (care everywhere).  Review of Systems   Past Medical History:  Diagnosis Date   Depression    Diastolic dysfunction    a. 01/2022 Echo: EF of 60 to 65% with no rwma, gr1 DD, normal RV fxn, and no evidence of MR.   Dyspnea    a. 01/2022 Echo: Nl LV/RV fxn w/o valvular dzs. GrI DD; b. 01/2022 Cardiac CT: Ca2+ = 0.   GERD (gastroesophageal reflux disease)    Migraine    Neck pain, chronic    Primary hypertension    Spinal stenosis     Current Outpatient Medications  Medication Sig Dispense Refill   aspirin  EC 81 MG  tablet Take 1 tablet by mouth daily. (Patient not taking: Reported on 09/03/2023)     atorvastatin  (LIPITOR) 40 MG tablet Take 1 tablet (40 mg total) by mouth daily. 30 tablet 0   buPROPion (WELLBUTRIN XL) 150 MG 24 hr tablet Take 1 tablet by mouth daily.     celecoxib (CELEBREX) 100 MG capsule Take 100 mg by mouth daily as needed for mild pain (pain score 1-3).     famotidine (PEPCID) 20 MG tablet Take 20 mg by mouth 2 (two) times daily as needed (acid reflux).     hydrochlorothiazide  (HYDRODIURIL ) 25 MG tablet Take 1 tablet (25 mg total) by mouth daily as needed. 30 tablet 6   HYDROcodone -acetaminophen  (NORCO) 7.5-325 MG tablet Take 1 tablet by mouth 3 (three) times daily as needed.     lidocaine  (LIDODERM ) 5 % Place onto the skin.     omeprazole (PRILOSEC) 40 MG capsule Take 40 mg by mouth 2 (two) times daily.     pregabalin (LYRICA) 100 MG capsule Take 100 mg by mouth 2 (two) times daily.     sertraline  (ZOLOFT ) 100 MG tablet Take 150 mg by mouth daily.     No current facility-administered medications for this visit.    Allergies  Allergen Reactions   Ibuprofen Other (See Comments)    Causes heartburn, burning in her throat.   Penicillins Rash    Family  History  Problem Relation Age of Onset   Heart failure Mother    Stomach cancer Father 50   Ovarian cancer Maternal Aunt    Ovarian cancer Maternal Aunt    Cancer Maternal Aunt        unk type; female origin   Lung cancer Paternal Uncle    Stomach cancer Paternal Grandmother        d. 13   Stomach cancer Other    Colon cancer Cousin        2 mat cousins; both d. 45s   Breast cancer Cousin        maternal cousin dx late 82s    Social History   Socioeconomic History   Marital status: Married    Spouse name: Not on file   Number of children: Not on file   Years of education: Not on file   Highest education level: Not on file  Occupational History   Not on file  Tobacco Use   Smoking status: Never   Smokeless tobacco:  Never  Vaping Use   Vaping status: Never Used  Substance and Sexual Activity   Alcohol use: Not Currently    Comment: occ   Drug use: No   Sexual activity: Not on file  Other Topics Concern   Not on file  Social History Narrative   Not on file   Social Drivers of Health   Financial Resource Strain: Low Risk  (10/09/2021)   Received from [facility], [facility]   Overall Financial Resource Strain (CARDIA)    Difficulty of Paying Living Expenses: Not hard at all  Food Insecurity: No Food Insecurity (09/03/2023)   Hunger Vital Sign    Worried About Running Out of Food in the Last Year: Never true    Ran Out of Food in the Last Year: Never true  Transportation Needs: No Transportation Needs (09/03/2023)   PRAPARE - Administrator, Civil Service (Medical): No    Lack of Transportation (Non-Medical): No  Physical Activity: Inactive (10/09/2021)   Received from [facility], [facility]   Exercise Vital Sign    Days of Exercise per Week: 0 days    Minutes of Exercise per Session: 0 min  Stress: No Stress Concern Present (10/09/2021)   Received from [facility], [facility] of Occupational Health - Occupational Stress Questionnaire    Feeling of Stress : Not at all  Social Connections: Socially Integrated (09/03/2023)   Social Connection and Isolation Panel [NHANES]    Frequency of Communication with Friends and Family: More than three times a week    Frequency of Social Gatherings with Friends and Family: More than three times a week    Attends Religious Services: 1 to 4 times per year    Active Member of [employer] or Organizations: Yes    Attends Engineer, structural: More than 4 times per year    Marital Status: Married  Catering manager Violence: Not At Risk (09/03/2023)   Humiliation, Afraid, Rape, and Kick questionnaire    Fear of Current or Ex-Partner: No    Emotionally Abused: No    Physically Abused: No    Sexually  Abused: No     Constitutional: Patient reports intermittent headaches.  Denies fever, malaise, fatigue, or abrupt weight changes.  HEENT: Denies eye pain, eye redness, ear pain, ringing in the ears, wax buildup, runny nose, nasal congestion, bloody nose, or sore throat.  Respiratory: Denies difficulty breathing, shortness of breath, cough or sputum production.   Cardiovascular: Denies chest pain, chest tightness, palpitations or swelling in the hands or feet.  Gastrointestinal: Denies abdominal pain, bloating, constipation, diarrhea or blood in the stool.  GU: Denies urgency, frequency, pain with urination, burning sensation, blood in urine, odor or discharge. Musculoskeletal: Patient reports chronic back pain, bilateral shoulder pain.  Denies decrease in range of motion, difficulty with gait, muscle pain or joint swelling.  Skin: Denies redness, rashes, lesions or ulcercations.  Neurological: Patient reports neuropathic pain of BLE.  Denies dizziness, difficulty with memory, difficulty with speech or problems with balance and coordination.  Psych: Patient has a history of depression.  Denies anxiety, SI/HI.  No other specific complaints in a complete review of systems (except as listed in HPI above).      Objective:   Physical Exam  BP 124/74 (BP Location: Right Arm, Patient Position: Sitting, Cuff Size: Normal)   Ht 5\' 3"  (1.6 m)   Wt 208 lb 9.6 oz (94.6 kg)   BMI 36.95 kg/m   Wt Readings from Last 3 Encounters:  09/02/23 187 lb 6.3 oz (85 kg)  01/14/22 187 lb 8 oz (85 kg)  11/13/21 190 lb (86.2 kg)    General: Appears her stated age,obese, in NAD. Skin: Warm, dry and intact. No rashes, lesions or ulcerations noted. HEENT: Head: normal shape and size; Eyes: sclera white, no icterus, conjunctiva pink, PERRLA and EOMs intact;  Cardiovascular: Normal rate and rhythm. S1,S2 noted.  No murmur, rubs or gallops noted. No JVD or BLE edema. No carotid bruits noted. Pulmonary/Chest:  Normal effort and positive vesicular breath sounds. No respiratory distress. No wheezes, rales or ronchi noted.  Abdomen: Normal bowel sounds. Musculoskeletal: Joint enlargement noted of fingers. Decreased external rotation noted of the right shoulder. Normal internal external rotation of the right shoulder. Pain with palpation of the right anterior proximal biceps tendon. Negative drop can test on the right. Strength 5/5 BUE/ Hand grips equal. No difficulty with gait.  Neurological: Alert and oriented. Cranial nerves II-XII grossly intact. Coordination normal.  Psychiatric: Mood and affect normal. Behavior is normal. Judgment and thought content normal.    BMET    Component Value Date/Time   NA 142 09/04/2023 0516   K 3.9 09/04/2023 0516   CL 109 09/04/2023 0516   CO2 27 09/04/2023 0516   GLUCOSE 118 (H) 09/04/2023 0516   BUN 13 09/04/2023 0516   CREATININE 0.96 09/04/2023 0516   CREATININE 0.88 11/20/2015 1638   CALCIUM  8.9 09/04/2023 0516   GFRNONAA >60 09/04/2023 0516   GFRNONAA 71 11/20/2015 1638   GFRAA >60 03/05/2019 1553   GFRAA 81 11/20/2015 1638    Lipid Panel     Component Value Date/Time   CHOL 145 09/04/2023 0516   TRIG 131 09/04/2023 0516   HDL 42 09/04/2023 0516   CHOLHDL 3.5 09/04/2023 0516   VLDL 26 09/04/2023 0516   LDLCALC 77 09/04/2023 0516    CBC    Component Value Date/Time   WBC 5.8 09/04/2023 0516   RBC 3.87 09/04/2023 0516   HGB 12.6 09/04/2023 0516   HCT 37.8 09/04/2023 0516   PLT 179 09/04/2023 0516   MCV 97.7 09/04/2023 0516   MCH 32.6 09/04/2023 0516   MCHC 33.3 09/04/2023 0516   RDW 12.7 09/04/2023 0516   LYMPHSABS 0.4 (L) 09/02/2023 2101   MONOABS 0.1 09/02/2023 2101   EOSABS 0.1 09/02/2023 2101   BASOSABS 0.0 09/02/2023 2101  Hgb A1C Lab Results  Component Value Date   HGBA1C 5.6 03/26/2016            Assessment & Plan:    RTC in 6 months for your annual exam Helayne Lo, NP

## 2023-09-24 NOTE — Assessment & Plan Note (Signed)
 Encouraged regular stretching and core strengthening She will continue hydrocodone  and pregablin per pain management

## 2023-09-24 NOTE — Assessment & Plan Note (Signed)
 Avoid foods that trigger your reflux Encouraged weight loss as this can help reduce reflux symptoms Continue omeprazole and famotadine

## 2023-09-24 NOTE — Assessment & Plan Note (Signed)
 Encouraged her to start calcium  and vit d Encouraged daily weight bearing exercise She will consider reclast

## 2023-09-24 NOTE — Assessment & Plan Note (Signed)
 Lipid profile reviewed She does not want to take atorvastatin  at this time Encouraged her to consume a low fat diet

## 2023-09-27 ENCOUNTER — Ambulatory Visit: Payer: Self-pay | Admitting: Internal Medicine

## 2023-09-28 DIAGNOSIS — G8929 Other chronic pain: Secondary | ICD-10-CM | POA: Diagnosis not present

## 2023-09-28 DIAGNOSIS — F331 Major depressive disorder, recurrent, moderate: Secondary | ICD-10-CM | POA: Diagnosis not present

## 2023-09-28 DIAGNOSIS — F411 Generalized anxiety disorder: Secondary | ICD-10-CM | POA: Diagnosis not present

## 2023-10-28 ENCOUNTER — Ambulatory Visit (INDEPENDENT_AMBULATORY_CARE_PROVIDER_SITE_OTHER): Admitting: Internal Medicine

## 2023-10-28 ENCOUNTER — Encounter: Payer: Self-pay | Admitting: Internal Medicine

## 2023-10-28 VITALS — BP 112/70 | HR 87 | Ht 63.0 in | Wt 204.0 lb

## 2023-10-28 DIAGNOSIS — R6883 Chills (without fever): Secondary | ICD-10-CM

## 2023-10-28 DIAGNOSIS — R232 Flushing: Secondary | ICD-10-CM

## 2023-10-28 DIAGNOSIS — I951 Orthostatic hypotension: Secondary | ICD-10-CM | POA: Diagnosis not present

## 2023-10-28 DIAGNOSIS — R5383 Other fatigue: Secondary | ICD-10-CM

## 2023-10-28 DIAGNOSIS — R42 Dizziness and giddiness: Secondary | ICD-10-CM

## 2023-10-28 LAB — CBC
HCT: 41.8 % (ref 35.0–45.0)
Hemoglobin: 14.1 g/dL (ref 11.7–15.5)
MCH: 33 pg (ref 27.0–33.0)
MCHC: 33.7 g/dL (ref 32.0–36.0)
MCV: 97.9 fL (ref 80.0–100.0)
MPV: 10.7 fL (ref 7.5–12.5)
Platelets: 278 10*3/uL (ref 140–400)
RBC: 4.27 10*6/uL (ref 3.80–5.10)
RDW: 12.3 % (ref 11.0–15.0)
WBC: 7.1 10*3/uL (ref 3.8–10.8)

## 2023-10-28 LAB — COMPREHENSIVE METABOLIC PANEL WITH GFR
AG Ratio: 1.7 (calc) (ref 1.0–2.5)
ALT: 14 U/L (ref 6–29)
AST: 12 U/L (ref 10–35)
Albumin: 4.3 g/dL (ref 3.6–5.1)
Alkaline phosphatase (APISO): 107 U/L (ref 37–153)
BUN/Creatinine Ratio: 19 (calc) (ref 6–22)
BUN: 20 mg/dL (ref 7–25)
CO2: 23 mmol/L (ref 20–32)
Calcium: 9.6 mg/dL (ref 8.6–10.4)
Chloride: 106 mmol/L (ref 98–110)
Creat: 1.07 mg/dL — ABNORMAL HIGH (ref 0.60–1.00)
Globulin: 2.5 g/dL (ref 1.9–3.7)
Glucose, Bld: 98 mg/dL (ref 65–139)
Potassium: 4 mmol/L (ref 3.5–5.3)
Sodium: 139 mmol/L (ref 135–146)
Total Bilirubin: 0.3 mg/dL (ref 0.2–1.2)
Total Protein: 6.8 g/dL (ref 6.1–8.1)
eGFR: 56 mL/min/{1.73_m2} — ABNORMAL LOW (ref >=60)

## 2023-10-28 MED ORDER — OMEPRAZOLE 40 MG PO CPDR: 40.0000 mg | DELAYED_RELEASE_CAPSULE | Freq: Two times a day (BID) | ORAL | 1 refills | Status: AC

## 2023-10-28 NOTE — Progress Notes (Signed)
 Subjective:    Patient ID: Robin Ochoa, female    DOB: 10/01/1955, 68 y.o.   MRN: [id number]  HPI  Discussed the use of AI scribe software for clinical note transcription with the patient, who gave verbal consent to proceed.  Robin Ochoa is a 68 year old female who presents with intermittent chills and dizziness following a recent hospitalization for sepsis secondary to bronchitis.  She has been experiencing intermittent chills and dizziness since her hospitalization on May 8th for bronchitis that led to sepsis. During her hospital stay, she experienced demand ischemia but did not suffer a myocardial infarction. The chills occur mostly at night, causing her to shake and seek warmth from her husband, though these episodes have become less frequent recently.  She describes feeling flushed with a temperature around 100F, which she acknowledges is not considered a fever. The flushing occurs in the mornings and is separate from the chills. No headaches, cough, shortness of breath, chest pain, or bowel and bladder issues. However, she experiences dizziness, particularly when standing up from a sitting position.  Her past medical history includes a prescription for HCTZ for swelling, which she has not taken in the last two weeks.   She has been under a lot of stress lately and expresses concern about her daughter.  She is unsure if these are contributing to her symptoms.       Review of Systems   Past Medical History:  Diagnosis Date   Abnormal vaginal Pap smear    Allergy    Colon polyps    Depression    Diastolic dysfunction    a. 01/2022 Echo: EF of 60 to 65% with no rwma, gr1 DD, normal RV fxn, and no evidence of MR.   Dyspnea    a. 01/2022 Echo: Nl LV/RV fxn w/o valvular dzs. GrI DD; b. 01/2022 Cardiac CT: Ca2+ = 0.   GERD (gastroesophageal reflux disease)    Kidney stones    Migraine    Neck pain, chronic    Osteoporosis    Primary hypertension    Spinal stenosis      Current Outpatient Medications  Medication Sig Dispense Refill   aspirin  EC 81 MG tablet Take 1 tablet by mouth daily. (Patient not taking: Reported on 09/03/2023)     atorvastatin  (LIPITOR) 40 MG tablet Take 1 tablet (40 mg total) by mouth daily. (Patient not taking: Reported on 09/24/2023) 30 tablet 0   buPROPion (WELLBUTRIN XL) 150 MG 24 hr tablet Take 1 tablet by mouth daily.     famotidine (PEPCID) 20 MG tablet Take 20 mg by mouth 2 (two) times daily as needed (acid reflux).     hydrochlorothiazide  (HYDRODIURIL ) 25 MG tablet Take 1 tablet (25 mg total) by mouth daily as needed. 30 tablet 6   HYDROcodone -acetaminophen  (NORCO) 7.5-325 MG tablet Take 1 tablet by mouth 3 (three) times daily as needed.     lidocaine  (LIDODERM ) 5 % Place onto the skin.     omeprazole (PRILOSEC) 40 MG capsule Take 40 mg by mouth 2 (two) times daily.     pregabalin (LYRICA) 100 MG capsule Take 100 mg by mouth 2 (two) times daily.     sertraline  (ZOLOFT ) 100 MG tablet Take 150 mg by mouth daily.     No current facility-administered medications for this visit.    Allergies  Allergen Reactions   Ibuprofen Other (See Comments)    Causes heartburn, burning in her throat.   Penicillins Rash  Family History  Problem Relation Age of Onset   Heart failure Mother    Stomach cancer Father 9   Ovarian cancer Maternal Aunt    Ovarian cancer Maternal Aunt    Cancer Maternal Aunt        unk type; female origin   Lung cancer Paternal Uncle    Stomach cancer Paternal Grandmother        d. 24   Stomach cancer Other    Colon cancer Cousin        2 mat cousins; both d. 47s   Breast cancer Cousin        maternal cousin dx late 67s    Social History   Socioeconomic History   Marital status: Married    Spouse name: Not on file   Number of children: Not on file   Years of education: Not on file   Highest education level: Some college, no degree  Occupational History   Not on file  Tobacco Use   Smoking  status: Never   Smokeless tobacco: Never  Vaping Use   Vaping status: Never Used  Substance and Sexual Activity   Alcohol use: Never    Comment: occ   Drug use: Never   Sexual activity: Yes  Other Topics Concern   Not on file  Social History Narrative   Not on file   Social Drivers of Health   Financial Resource Strain: Low Risk  (10/09/2021)   Received from [facility]   Overall Financial Resource Strain (CARDIA)    Difficulty of Paying Living Expenses: Not hard at all  Food Insecurity: No Food Insecurity (09/03/2023)   Hunger Vital Sign    Worried About Running Out of Food in the Last Year: Never true    Ran Out of Food in the Last Year: Never true  Transportation Needs: No Transportation Needs (09/03/2023)   PRAPARE - Administrator, Civil Service (Medical): No    Lack of Transportation (Non-Medical): No  Physical Activity: Inactive (10/09/2021)   Received from [facility]   Exercise Vital Sign    On average, how many days per week do you engage in moderate to strenuous exercise (like a brisk walk)?: 0 days    On average, how many minutes do you engage in exercise at this level?: 0 min  Stress: No Stress Concern Present (10/09/2021)   Received from [facility] - Occupational Stress Questionnaire    Feeling of Stress : Not at all  Social Connections: Socially Integrated (09/03/2023)   Social Connection and Isolation Panel    Frequency of Communication with Friends and Family: More than three times a week    Frequency of Social Gatherings with Friends and Family: More than three times a week    Attends Religious Services: 1 to 4 times per year    Active Member of [employer] or Organizations: Yes    Attends Banker Meetings: More than 4 times per year    Marital Status: Married  Catering manager Violence: Not At Risk (09/03/2023)   Humiliation, Afraid, Rape, and Kick questionnaire    Fear of Current or  Ex-Partner: No    Emotionally Abused: No    Physically Abused: No    Sexually Abused: No     Constitutional: Patient reports fatigue.  Denies fever, malaise, fatigue, or abrupt weight changes.  HEENT: Denies eye pain, eye redness, ear pain, ringing in the ears, wax  buildup, runny nose, nasal congestion, bloody nose, or sore throat. Respiratory: Denies difficulty breathing, shortness of breath, cough or sputum production.   Cardiovascular: Denies chest pain, chest tightness, palpitations or swelling in the hands or feet.  Gastrointestinal: Denies abdominal pain, bloating, constipation, diarrhea or blood in the stool.  GU: Denies urgency, frequency, pain with urination, burning sensation, blood in urine, odor or discharge. Musculoskeletal: Patient reports chronic back pain, bilateral shoulder pain.  Denies decrease in range of motion, difficulty with gait, muscle pain or joint swelling.  Skin: Pt reports feeling flushed. Denies redness, rashes, lesions or ulcercations.  Neurological: Patient reports neuropathic pain of BLE, dizziness.  Denies difficulty with memory, difficulty with speech or problems with balance and coordination.  Psych: Patient has a history of depression.  Denies anxiety, SI/HI.  No other specific complaints in a complete review of systems (except as listed in HPI above).      Objective:   Physical Exam  BP 112/70 (BP Location: Right Arm, Patient Position: Sitting, Cuff Size: Normal)   Pulse 87   Ht 5' 3 (1.6 m)   Wt 204 lb (92.5 kg)   SpO2 97%   BMI 36.14 kg/m    Wt Readings from Last 3 Encounters:  09/24/23 208 lb 9.6 oz (94.6 kg)  09/02/23 187 lb 6.3 oz (85 kg)  01/14/22 187 lb 8 oz (85 kg)    General: Appears her stated age,obese, in NAD. Skin: Warm, dry and intact.  HEENT: Head: normal shape and size; Eyes: sclera white, no icterus, conjunctiva pink, PERRLA and EOMs intact;  Cardiovascular: Normal rate and rhythm. S1,S2 noted.  No murmur, rubs or  gallops noted. No JVD or BLE edema.  Pulmonary/Chest: Normal effort and positive vesicular breath sounds. No respiratory distress. No wheezes, rales or ronchi noted.  Musculoskeletal: No difficulty with gait.  Neurological: Alert and oriented. Coordination normal.  Psychiatric: Mood and affect normal. Behavior is normal. Judgment and thought content normal.    BMET    Component Value Date/Time   NA 138 09/24/2023 1511   K 4.1 09/24/2023 1511   CL 105 09/24/2023 1511   CO2 23 09/24/2023 1511   GLUCOSE 103 (H) 09/24/2023 1511   BUN 22 09/24/2023 1511   CREATININE 1.03 (H) 09/24/2023 1511   CALCIUM  9.4 09/24/2023 1511   GFRNONAA >60 09/04/2023 0516   GFRNONAA 71 11/20/2015 1638   GFRAA >60 03/05/2019 1553   GFRAA 81 11/20/2015 1638    Lipid Panel     Component Value Date/Time   CHOL 145 09/04/2023 0516   TRIG 131 09/04/2023 0516   HDL 42 09/04/2023 0516   CHOLHDL 3.5 09/04/2023 0516   VLDL 26 09/04/2023 0516   LDLCALC 77 09/04/2023 0516    CBC    Component Value Date/Time   WBC 7.1 09/24/2023 1511   RBC 3.98 09/24/2023 1511   HGB 12.8 09/24/2023 1511   HCT 39.1 09/24/2023 1511   PLT 259 09/24/2023 1511   MCV 98.2 09/24/2023 1511   MCH 32.2 09/24/2023 1511   MCHC 32.7 09/24/2023 1511   RDW 12.6 09/24/2023 1511   LYMPHSABS 0.4 (L) 09/02/2023 2101   MONOABS 0.1 09/02/2023 2101   EOSABS 0.1 09/02/2023 2101   BASOSABS 0.0 09/02/2023 2101    Hgb A1C Lab Results  Component Value Date   HGBA1C 5.8 (H) 09/24/2023            Assessment & Plan:  Assessment and Plan    Fatigue, intermittent chills and flushing  Intermittent chills and flushing since hospitalization for bronchitis and sepsis. Chills mainly at night, not linked to hot flashes or sweating. Flushing episodes not true fevers. Differential includes residual infection effects, stress-related symptoms, or undiagnosed condition. Thyroid  function normal. Symptoms decreasing, suggesting resolution. Stress  and mood may contribute, but chills atypical for stress. - Order blood tests: CBC, liver and kidney function. - Advise increased water intake.  Dizziness Dizziness upon standing, possibly due to orthostatic hypotension or dehydration. Not on antihypertensives. Previous hydrochlorothiazide  for swelling, not recently taken. Hydration may prevent dizziness. - Orthostatics were positive. - Advise increased water intake. - Make position changes slowly - I am hesitant to add midodrine 5 mg twice daily at this time because I do not want to make her hypertensive - She will follow-up with cardiology in a few weeks if the symptoms do not resolve    RTC in 4 months for your annual exam Angeline Laura, NP

## 2023-10-28 NOTE — Patient Instructions (Signed)
 Orthostatic Hypotension Blood pressure is a measurement of how strongly, or weakly, your circulating blood is pressing against the walls of your arteries. Orthostatic hypotension is a drop in blood pressure that can happen when you change positions, such as when you go from lying down to standing. Arteries are blood vessels that carry blood from your heart throughout your body. When blood pressure is too low, you may not get enough blood to your brain or to the rest of your organs. Orthostatic hypotension can cause light-headedness, sweating, rapid heartbeat, blurred vision, and fainting. These symptoms require further investigation into the cause. What are the causes? Orthostatic hypotension can be caused by many things, including: Sudden changes in posture, such as standing up quickly after you have been sitting or lying down. Loss of blood (anemia) or loss of body fluids (dehydration). Heart problems, neurologic problems, or hormone problems. Pregnancy. Aging. The risk for this condition increases as you get older. Severe infection (sepsis). Certain medicines, such as medicines for high blood pressure or medicines that make the body lose excess fluids (diuretics). What are the signs or symptoms? Symptoms of this condition may include: Weakness, light-headedness, or dizziness. Sweating. Blurred vision. Tiredness (fatigue). Rapid heartbeat. Fainting, in severe cases. How is this diagnosed? This condition is diagnosed based on: Your symptoms and medical history. Your blood pressure measurements. Your health care provider will check your blood pressure when you are: Lying down. Sitting. Standing. A blood pressure reading is recorded as two numbers, such as "120 over 80" (or 120/80). The first ("top") number is called the systolic pressure. It is a measure of the pressure in your arteries as your heart beats. The second ("bottom") number is called the diastolic pressure. It is a measure of  the pressure in your arteries when your heart relaxes between beats. Blood pressure is measured in a unit called mmHg. Healthy blood pressure for most adults is 120/80 mmHg. Orthostatic hypotension is defined as a 20 mmHg drop in systolic pressure or a 10 mmHg drop in diastolic pressure within 3 minutes of standing. Other information or tests that may be used to diagnose orthostatic hypotension include: Your other vital signs, such as your heart rate and temperature. Blood tests. An electrocardiogram (ECG) or echocardiogram. A Holter monitor. This is a device you wear that records your heart rhythm continuously, usually for 24-48 hours. Tilt table test. For this test, you will be safely secured to a table that moves you from a lying position to an upright position. Your heart rhythm and blood pressure will be monitored during the test. How is this treated? This condition may be treated by: Changing your diet. This may involve eating more salt (sodium) or drinking more water. Changing the dosage of certain medicines you are taking that might be lowering your blood pressure. Correcting the underlying reason for the orthostatic hypotension. Wearing compression stockings. Taking medicines to raise your blood pressure. Avoiding actions that trigger symptoms. Follow these instructions at home: Medicines Take over-the-counter and prescription medicines only as told by your health care provider. Follow instructions from your health care provider about changing the dosage of your current medicines, if this applies. Do not stop or adjust any of your medicines on your own. Eating and drinking  Drink enough fluid to keep your urine pale yellow. Eat extra salt only as directed. Do not add extra salt to your diet unless advised by your health care provider. Eat frequent, small meals. Avoid standing up suddenly after eating. General instructions  Get up slowly from lying down or sitting positions. This  gives your blood pressure a chance to adjust. Avoid hot showers and excessive heat as directed by your health care provider. Engage in regular physical activity as directed by your health care provider. If you have compression stockings, wear them as told. Keep all follow-up visits. This is important. Contact a health care provider if: You have a fever for more than 2-3 days. You feel more thirsty than usual. You feel dizzy or weak. Get help right away if: You have chest pain. You have a fast or irregular heartbeat. You become sweaty or feel light-headed. You feel short of breath. You faint. You have any symptoms of a stroke. "BE FAST" is an easy way to remember the main warning signs of a stroke: B - Balance. Signs are dizziness, sudden trouble walking, or loss of balance. E - Eyes. Signs are trouble seeing or a sudden change in vision. F - Face. Signs are sudden weakness or numbness of the face, or the face or eyelid drooping on one side. A - Arms. Signs are weakness or numbness in an arm. This happens suddenly and usually on one side of the body. S - Speech. Signs are sudden trouble speaking, slurred speech, or trouble understanding what people say. T - Time. Time to call emergency services. Write down what time symptoms started. You have other signs of a stroke, such as: A sudden, severe headache with no known cause. Nausea or vomiting. Seizure. These symptoms may represent a serious problem that is an emergency. Do not wait to see if the symptoms will go away. Get medical help right away. Call your local emergency services (911 in the [place] Do not drive yourself to the hospital. Summary Orthostatic hypotension is a sudden drop in blood pressure. It can cause light-headedness, sweating, rapid heartbeat, blurred vision, and fainting. Orthostatic hypotension can be diagnosed by having your blood pressure taken while lying down, sitting, and then standing. Treatment may involve  changing your diet, wearing compression stockings, sitting up slowly, adjusting your medicines, or correcting the underlying reason for the orthostatic hypotension. Get help right away if you have chest pain, a fast or irregular heartbeat, or symptoms of a stroke. This information is not intended to replace advice given to you by your health care provider. Make sure you discuss any questions you have with your health care provider. Document Revised: 06/27/2020 Document Reviewed: 06/27/2020 Elsevier Patient Education  2024 [employer].

## 2023-10-30 ENCOUNTER — Ambulatory Visit: Payer: Self-pay | Admitting: Internal Medicine

## 2023-11-25 NOTE — Progress Notes (Unsigned)
 Cardiology Office Note  Date:  11/26/2023   ID:  Robin Ochoa, DOB 1955-11-25, MRN [id number]  PCP:  Antonette Angeline ORN, NP   Chief Complaint  Patient presents with   [facility] follow up; 08/2023 tachycardia     Patient c/o fatigue, dizziness, chest tightness & shortness of breath with exertion.     HPI:  Robin Ochoa is a pleasant 68 year old woman with history of obesity,  broken pelvis in 2014,  echocardiogram in 2014 suggesting moderate mitral valve regurgitation with normal ejection fraction (read by Dr. Sula),  Subsequent echocardiograms with no significant mitral valve regurgitation Calcium  score 2023: zero who presents for f/u of her  shortness of breath, fatigue, hyperlipidemia  Last seen in clinic by myself 9/23 Admission to the hospital May 2025 SIRS, acute bronchitis, Troponin up to >400, felt to be demand ischemia  Echo 09/03/23 Normal left and right ventricular size and function No significant valvular disease  Reports she is still recovering from bronchitis symptoms After discharge she continued to have slight chills, felt cold at home  Trying to stay active but gets tired No regular exercise program Denies significant chest pain or shortness of breath concerning for angina Blood pressure stable  EKG personally reviewed by myself on todays visit EKG Interpretation Date/Time:  Friday November 26 2023 09:36:17 EDT Ventricular Rate:  76 PR Interval:  138 QRS Duration:  90 QT Interval:  370 QTC Calculation: 416 R Axis:   18  Text Interpretation: Normal sinus rhythm Nonspecific ST and T wave abnormality When compared with ECG of 02-Sep-2023 23:35, No significant change was found Confirmed by Perla Lye [phone]) on 11/26/2023 9:40:55 AM    Suffered a fall in 2014, broke her pelvis   Echocardiogram in 2014 with normal ejection fraction, mild TR, moderate MR (read by outside physician)   Family history; mother died at age 36, father died age 14 from stomach  cancer  PMH:   has a past medical history of Abnormal vaginal Pap smear, Allergy, Colon polyps, Depression, Diastolic dysfunction, Dyspnea, GERD (gastroesophageal reflux disease), Kidney stones, Migraine, Neck pain, chronic, Osteoporosis, Primary hypertension, and Spinal stenosis.  PSH:    Past Surgical History:  Procedure Laterality Date   BACK SURGERY     COLONOSCOPY WITH PROPOFOL  N/A 10/07/2020   Procedure: COLONOSCOPY WITH PROPOFOL ;  Surgeon: Therisa Bi, MD;  Location: [facility] ENDOSCOPY;  Service: Gastroenterology;  Laterality: N/A;   COLONOSCOPY WITH PROPOFOL  N/A 11/14/2020   Procedure: COLONOSCOPY WITH PROPOFOL ;  Surgeon: Therisa Bi, MD;  Location: [facility] ENDOSCOPY;  Service: Gastroenterology;  Laterality: N/A;   COLONOSCOPY WITH PROPOFOL  N/A 11/13/2021   Procedure: COLONOSCOPY WITH PROPOFOL ;  Surgeon: Therisa Bi, MD;  Location: [facility] ENDOSCOPY;  Service: Gastroenterology;  Laterality: N/A;  3rd case   ESOPHAGOGASTRODUODENOSCOPY  11/13/2021   Procedure: ESOPHAGOGASTRODUODENOSCOPY (EGD);  Surgeon: Therisa Bi, MD;  Location[facility] ENDOSCOPY;  Service: Gastroenterology;;   ESOPHAGOGASTRODUODENOSCOPY (EGD) WITH PROPOFOL  N/A 05/16/2021   Procedure: ESOPHAGOGASTRODUODENOSCOPY (EGD) WITH PROPOFOL ;  Surgeon: Therisa Bi, MD;  Location: [facility] ENDOSCOPY;  Service: Gastroenterology;  Laterality: N/A;   VAGINAL HYSTERECTOMY  68 years old   Tumors-     Current Outpatient Medications  Medication Sig Dispense Refill   atorvastatin  (LIPITOR) 40 MG tablet Take 1 tablet (40 mg total) by mouth daily. 30 tablet 0   buPROPion (WELLBUTRIN XL) 150 MG 24 hr tablet Take 1 tablet by mouth daily.     celecoxib (CELEBREX) 100 MG capsule Take 100 mg by mouth daily.  famotidine (PEPCID) 20 MG tablet Take 20 mg by mouth 2 (two) times daily as needed (acid reflux).     HYDROcodone -acetaminophen  (NORCO) 7.5-325 MG tablet Take 1 tablet by mouth 3 (three) times daily as needed.     lidocaine  (LIDODERM ) 5 % Place onto the  skin.     omeprazole  (PRILOSEC) 40 MG capsule Take 1 capsule (40 mg total) by mouth 2 (two) times daily. 180 capsule 1   pregabalin (LYRICA) 100 MG capsule Take 100 mg by mouth 2 (two) times daily.     sertraline  (ZOLOFT ) 100 MG tablet Take 150 mg by mouth daily.     No current facility-administered medications for this visit.    Allergies:   Ibuprofen and Penicillins   Social History:  The patient  reports that she has never smoked. She has never used smokeless tobacco. She reports that she does not drink alcohol and does not use drugs.   Family History:   family history includes Breast cancer in her cousin; Cancer in her maternal aunt; Colon cancer in her cousin; Heart failure in her mother; Lung cancer in her paternal uncle; Ovarian cancer in her maternal aunt and maternal aunt; Stomach cancer in her paternal grandmother and another family member; Stomach cancer (age of onset: 19) in her father.    Review of Systems: Review of Systems  Constitutional: Negative.   HENT: Negative.    Respiratory: Negative.    Cardiovascular: Negative.   Gastrointestinal: Negative.   Musculoskeletal: Negative.   Neurological: Negative.   Psychiatric/Behavioral: Negative.    All other systems reviewed and are negative.   PHYSICAL EXAM: VS:  BP 122/80 (BP Location: Left Arm, Patient Position: Sitting, Cuff Size: Large)   Pulse 76   Ht 5' 3 (1.6 m)   Wt 206 lb (93.4 kg)   SpO2 97%   BMI 36.49 kg/m  , BMI Body mass index is 36.49 kg/m. Constitutional:  oriented to person, place, and time. No distress.  HENT:  Head: Grossly normal Eyes:  no discharge. No scleral icterus.  Neck: No JVD, no carotid bruits  Cardiovascular: Regular rate and rhythm, no murmurs appreciated Pulmonary/Chest: Clear to auscultation bilaterally, no wheezes or rails Abdominal: Soft.  no distension.  no tenderness.  Musculoskeletal: Normal range of motion Neurological:  normal muscle tone. Coordination normal. No  atrophy Skin: Skin warm and dry Psychiatric: normal affect, pleasant  Recent Labs: 09/02/2023: B Natriuretic Peptide 25.6 09/24/2023: TSH 3.76 10/28/2023: ALT 14; BUN 20; Creat 1.07; Hemoglobin 14.1; Platelets 278; Potassium 4.0; Sodium 139    Lipid Panel Lab Results  Component Value Date   CHOL 145 09/04/2023   HDL 42 09/04/2023   LDLCALC 77 09/04/2023   TRIG 131 09/04/2023      Wt Readings from Last 3 Encounters:  11/26/23 206 lb (93.4 kg)  10/28/23 204 lb (92.5 kg)  09/24/23 208 lb 9.6 oz (94.6 kg)     ASSESSMENT AND PLAN:  Problem List Items Addressed This Visit       Cardiology Problems   Mixed hyperlipidemia   Other Visit Diagnoses       Mitral valve disease    -  Primary   Relevant Orders   EKG 12-Lead (Completed)     Lower extremity edema         Shortness of breath           Shortness of breath Recent echocardiogram with normal LV function no valvular disease -Calcium  score 0 -Elevated troponin in the setting  of bronchitis, sepsis in the hospital, likely demand ischemia Denies anginal symptoms -No further cardiac workup needed Recommend she call for any chest pain symptoms  Hyperlipidemia Calcium  score of 0 Continue Lipitor 40 daily  Fatigue Recommend regular walking program    Signed, Velinda Lunger, M.D., Ph.D. [facility] [place], [place] [phone]

## 2023-11-26 ENCOUNTER — Encounter: Payer: Self-pay | Admitting: Cardiovascular Disease

## 2023-11-26 ENCOUNTER — Ambulatory Visit: Attending: Cardiovascular Disease | Admitting: Cardiovascular Disease

## 2023-11-26 VITALS — BP 122/80 | HR 76 | Ht 63.0 in | Wt 206.0 lb

## 2023-11-26 DIAGNOSIS — E782 Mixed hyperlipidemia: Secondary | ICD-10-CM | POA: Diagnosis not present

## 2023-11-26 DIAGNOSIS — I059 Rheumatic mitral valve disease, unspecified: Secondary | ICD-10-CM

## 2023-11-26 DIAGNOSIS — R600 Localized edema: Secondary | ICD-10-CM

## 2023-11-26 DIAGNOSIS — R0602 Shortness of breath: Secondary | ICD-10-CM | POA: Diagnosis not present

## 2023-11-26 NOTE — Patient Instructions (Addendum)

## 2023-12-10 DIAGNOSIS — M25512 Pain in left shoulder: Secondary | ICD-10-CM | POA: Diagnosis not present

## 2023-12-10 DIAGNOSIS — M25511 Pain in right shoulder: Secondary | ICD-10-CM | POA: Diagnosis not present

## 2023-12-10 DIAGNOSIS — M5416 Radiculopathy, lumbar region: Secondary | ICD-10-CM | POA: Diagnosis not present

## 2023-12-10 DIAGNOSIS — M47816 Spondylosis without myelopathy or radiculopathy, lumbar region: Secondary | ICD-10-CM | POA: Diagnosis not present

## 2023-12-10 DIAGNOSIS — M19012 Primary osteoarthritis, left shoulder: Secondary | ICD-10-CM | POA: Diagnosis not present

## 2023-12-10 DIAGNOSIS — Z5181 Encounter for therapeutic drug level monitoring: Secondary | ICD-10-CM | POA: Diagnosis not present

## 2023-12-10 DIAGNOSIS — Z79891 Long term (current) use of opiate analgesic: Secondary | ICD-10-CM | POA: Diagnosis not present

## 2023-12-10 DIAGNOSIS — G8929 Other chronic pain: Secondary | ICD-10-CM | POA: Diagnosis not present

## 2023-12-10 DIAGNOSIS — M961 Postlaminectomy syndrome, not elsewhere classified: Secondary | ICD-10-CM | POA: Diagnosis not present

## 2023-12-10 DIAGNOSIS — G894 Chronic pain syndrome: Secondary | ICD-10-CM | POA: Diagnosis not present

## 2023-12-10 DIAGNOSIS — Z1331 Encounter for screening for depression: Secondary | ICD-10-CM | POA: Diagnosis not present

## 2024-01-04 DIAGNOSIS — M25511 Pain in right shoulder: Secondary | ICD-10-CM | POA: Diagnosis not present

## 2024-01-04 DIAGNOSIS — M19011 Primary osteoarthritis, right shoulder: Secondary | ICD-10-CM | POA: Diagnosis not present

## 2024-01-04 DIAGNOSIS — G8929 Other chronic pain: Secondary | ICD-10-CM | POA: Diagnosis not present

## 2024-01-26 DIAGNOSIS — K080 Exfoliation of teeth due to systemic causes: Secondary | ICD-10-CM | POA: Diagnosis not present

## 2024-02-29 DIAGNOSIS — M25511 Pain in right shoulder: Secondary | ICD-10-CM | POA: Diagnosis not present

## 2024-02-29 DIAGNOSIS — M25512 Pain in left shoulder: Secondary | ICD-10-CM | POA: Diagnosis not present

## 2024-02-29 DIAGNOSIS — G8929 Other chronic pain: Secondary | ICD-10-CM | POA: Diagnosis not present

## 2024-02-29 LAB — COMPREHENSIVE METABOLIC PANEL WITH GFR
AG Ratio: 1.9 (calc) (ref 1.0–2.5)
ALT: 14 U/L (ref 6–29)
AST: 13 U/L (ref 10–35)
Albumin: 4.4 g/dL (ref 3.6–5.1)
Alkaline phosphatase (APISO): 105 U/L (ref 37–153)
BUN/Creatinine Ratio: 21 (calc) (ref 6–22)
BUN: 22 mg/dL (ref 7–25)
CO2: 23 mmol/L (ref 20–32)
Calcium: 9.4 mg/dL (ref 8.6–10.4)
Chloride: 105 mmol/L (ref 98–110)
Creat: 1.03 mg/dL — ABNORMAL HIGH (ref 0.60–1.00)
Globulin: 2.3 g/dL (ref 1.9–3.7)
Glucose, Bld: 103 mg/dL (ref 65–139)
Potassium: 4.1 mmol/L (ref 3.5–5.3)
Sodium: 138 mmol/L (ref 135–146)
Total Bilirubin: 0.3 mg/dL (ref 0.2–1.2)
Total Protein: 6.7 g/dL (ref 6.1–8.1)
eGFR: 58 mL/min/1.73m2 — ABNORMAL LOW (ref >=60)

## 2024-02-29 LAB — CBC
HCT: 39.1 % (ref 35.0–45.0)
Hemoglobin: 12.8 g/dL (ref 11.7–15.5)
MCH: 32.2 pg (ref 27.0–33.0)
MCHC: 32.7 g/dL (ref 32.0–36.0)
MCV: 98.2 fL (ref 80.0–100.0)
MPV: 11.2 fL (ref 7.5–12.5)
Platelets: 259 Thousand/uL (ref 140–400)
RBC: 3.98 Million/uL (ref 3.80–5.10)
RDW: 12.6 % (ref 11.0–15.0)
WBC: 7.1 Thousand/uL (ref 3.8–10.8)

## 2024-02-29 LAB — VITAMIN D 25 HYDROXY (VIT D DEFICIENCY, FRACTURES): Vit D, 25-Hydroxy: 31 ng/mL (ref 30–100)

## 2024-02-29 LAB — HEMOGLOBIN A1C
Hgb A1c MFr Bld: 5.8 % — ABNORMAL HIGH (ref <5.7)
Mean Plasma Glucose: 120 mg/dL
eAG (mmol/L): 6.6 mmol/L

## 2024-02-29 LAB — TSH: TSH: 3.76 m[IU]/L (ref 0.40–4.50)

## 2024-02-29 LAB — VITAMIN B12: Vitamin B-12: 735 pg/mL (ref 200–1100)

## 2024-03-03 DIAGNOSIS — G8929 Other chronic pain: Secondary | ICD-10-CM | POA: Diagnosis not present

## 2024-03-03 DIAGNOSIS — M19012 Primary osteoarthritis, left shoulder: Secondary | ICD-10-CM | POA: Diagnosis not present

## 2024-03-03 DIAGNOSIS — Z5181 Encounter for therapeutic drug level monitoring: Secondary | ICD-10-CM | POA: Diagnosis not present

## 2024-03-03 DIAGNOSIS — Z79891 Long term (current) use of opiate analgesic: Secondary | ICD-10-CM | POA: Diagnosis not present

## 2024-03-03 DIAGNOSIS — M961 Postlaminectomy syndrome, not elsewhere classified: Secondary | ICD-10-CM | POA: Diagnosis not present

## 2024-03-03 DIAGNOSIS — M47816 Spondylosis without myelopathy or radiculopathy, lumbar region: Secondary | ICD-10-CM | POA: Diagnosis not present

## 2024-03-03 DIAGNOSIS — G894 Chronic pain syndrome: Secondary | ICD-10-CM | POA: Diagnosis not present

## 2024-03-03 DIAGNOSIS — M5416 Radiculopathy, lumbar region: Secondary | ICD-10-CM | POA: Diagnosis not present

## 2024-03-03 DIAGNOSIS — M25512 Pain in left shoulder: Secondary | ICD-10-CM | POA: Diagnosis not present

## 2024-03-28 ENCOUNTER — Encounter: Payer: Self-pay | Admitting: Internal Medicine

## 2024-03-28 ENCOUNTER — Ambulatory Visit: Admitting: Internal Medicine

## 2024-03-28 VITALS — BP 124/74 | Ht 63.0 in | Wt 205.4 lb

## 2024-03-28 DIAGNOSIS — E66812 Obesity, class 2: Secondary | ICD-10-CM

## 2024-03-28 DIAGNOSIS — Z1159 Encounter for screening for other viral diseases: Secondary | ICD-10-CM | POA: Diagnosis not present

## 2024-03-28 DIAGNOSIS — E782 Mixed hyperlipidemia: Secondary | ICD-10-CM | POA: Diagnosis not present

## 2024-03-28 DIAGNOSIS — R7303 Prediabetes: Secondary | ICD-10-CM | POA: Diagnosis not present

## 2024-03-28 DIAGNOSIS — Z0001 Encounter for general adult medical examination with abnormal findings: Secondary | ICD-10-CM

## 2024-03-28 DIAGNOSIS — Z6836 Body mass index (BMI) 36.0-36.9, adult: Secondary | ICD-10-CM

## 2024-03-28 MED ORDER — FAMOTIDINE 20 MG PO TABS: 20.0000 mg | ORAL_TABLET | Freq: Two times a day (BID) | ORAL | 1 refills | Status: AC | PRN

## 2024-03-28 NOTE — Patient Instructions (Signed)
 Health Maintenance for Postmenopausal Women Menopause is a normal process in which your ability to get pregnant comes to an end. This process happens slowly over many months or years, usually between the ages of 76 and 38. Menopause is complete when you have missed your menstrual period for 12 months. It is important to talk with your health care provider about some of the most common conditions that affect women after menopause (postmenopausal women). These include heart disease, cancer, and bone loss (osteoporosis). Adopting a healthy lifestyle and getting preventive care can help to promote your health and wellness. The actions you take can also lower your chances of developing some of these common conditions. What are the signs and symptoms of menopause? During menopause, you may have the following symptoms: Hot flashes. These can be moderate or severe. Night sweats. Decrease in sex drive. Mood swings. Headaches. Tiredness (fatigue). Irritability. Memory problems. Problems falling asleep or staying asleep. Talk with your health care provider about treatment options for your symptoms. Do I need hormone replacement therapy? Hormone replacement therapy is effective in treating symptoms that are caused by menopause, such as hot flashes and night sweats. Hormone replacement carries certain risks, especially as you become older. If you are thinking about using estrogen or estrogen with progestin, discuss the benefits and risks with your health care provider. How can I reduce my risk for heart disease and stroke? The risk of heart disease, heart attack, and stroke increases as you age. One of the causes may be a change in the body's hormones during menopause. This can affect how your body uses dietary fats, triglycerides, and cholesterol. Heart attack and stroke are medical emergencies. There are many things that you can do to help prevent heart disease and stroke. Watch your blood pressure High  blood pressure causes heart disease and increases the risk of stroke. This is more likely to develop in people who have high blood pressure readings or are overweight. Have your blood pressure checked: Every 3-5 years if you are 32-23 years of age. Every year if you are 31 years old or older. Eat a healthy diet  Eat a diet that includes plenty of vegetables, fruits, low-fat dairy products, and lean protein. Do not eat a lot of foods that are high in solid fats, added sugars, or sodium. Get regular exercise Get regular exercise. This is one of the most important things you can do for your health. Most adults should: Try to exercise for at least 150 minutes each week. The exercise should increase your heart rate and make you sweat (moderate-intensity exercise). Try to do strengthening exercises at least twice each week. Do these in addition to the moderate-intensity exercise. Spend less time sitting. Even light physical activity can be beneficial. Other tips Work with your health care provider to achieve or maintain a healthy weight. Do not use any products that contain nicotine or tobacco. These products include cigarettes, chewing tobacco, and vaping devices, such as e-cigarettes. If you need help quitting, ask your health care provider. Know your numbers. Ask your health care provider to check your cholesterol and your blood sugar (glucose). Continue to have your blood tested as directed by your health care provider. Do I need screening for cancer? Depending on your health history and family history, you may need to have cancer screenings at different stages of your life. This may include screening for: Breast cancer. Cervical cancer. Lung cancer. Colorectal cancer. What is my risk for osteoporosis? After menopause, you may be  at increased risk for osteoporosis. Osteoporosis is a condition in which bone destruction happens more quickly than new bone creation. To help prevent osteoporosis or  the bone fractures that can happen because of osteoporosis, you may take the following actions: If you are 24-54 years old, get at least 1,000 mg of calcium and at least 600 international units (IU) of vitamin D  per day. If you are older than age 75 but younger than age 30, get at least 1,200 mg of calcium and at least 600 international units (IU) of vitamin D  per day. If you are older than age 8, get at least 1,200 mg of calcium and at least 800 international units (IU) of vitamin D  per day. Smoking and drinking excessive alcohol increase the risk of osteoporosis. Eat foods that are rich in calcium and vitamin D , and do weight-bearing exercises several times each week as directed by your health care provider. How does menopause affect my mental health? Depression may occur at any age, but it is more common as you become older. Common symptoms of depression include: Feeling depressed. Changes in sleep patterns. Changes in appetite or eating patterns. Feeling an overall lack of motivation or enjoyment of activities that you previously enjoyed. Frequent crying spells. Talk with your health care provider if you think that you are experiencing any of these symptoms. General instructions See your health care provider for regular wellness exams and vaccines. This may include: Scheduling regular health, dental, and eye exams. Getting and maintaining your vaccines. These include: Influenza vaccine. Get this vaccine each year before the flu season begins. Pneumonia vaccine. Shingles vaccine. Tetanus, diphtheria, and pertussis (Tdap) booster vaccine. Your health care provider may also recommend other immunizations. Tell your health care provider if you have ever been abused or do not feel safe at home. Summary Menopause is a normal process in which your ability to get pregnant comes to an end. This condition causes hot flashes, night sweats, decreased interest in sex, mood swings, headaches, or lack  of sleep. Treatment for this condition may include hormone replacement therapy. Take actions to keep yourself healthy, including exercising regularly, eating a healthy diet, watching your weight, and checking your blood pressure and blood sugar levels. Get screened for cancer and depression. Make sure that you are up to date with all your vaccines. This information is not intended to replace advice given to you by your health care provider. Make sure you discuss any questions you have with your health care provider. Document Revised: 09/02/2020 Document Reviewed: 09/02/2020 Elsevier Patient Education  2024 [employer].

## 2024-03-28 NOTE — Progress Notes (Signed)
 Subjective:    Patient ID: Robin Ochoa, female    DOB: Jun 07, 1955, 68 y.o.   MRN: [id number]  HPI  Patient presents to clinic today for her annual exam.  Flu: 12/2023 Tetanus: > 10 years ago COVID: X 2 Pneumovax: 06/2020 Prevnar 20: 02/2023 Zostavax: 10/2015 Shingrix: Never Pap smear: Hysterectomy Mammogram: 05/2023 Bone density:01/2023 Colon screening: 10/2021 Vision screening: annually Dentist: biannually  Diet: She does eat meat. She consumes some fruits and veggies. She tries to avoid fried foods. She drinks mostly water, iced tea. Exercise: None   Review of Systems   Past Medical History:  Diagnosis Date   Abnormal vaginal Pap smear    Allergy    Colon polyps    Depression    Diastolic dysfunction    a. 01/2022 Echo: EF of 60 to 65% with no rwma, gr1 DD, normal RV fxn, and no evidence of MR.   Dyspnea    a. 01/2022 Echo: Nl LV/RV fxn w/o valvular dzs. GrI DD; b. 01/2022 Cardiac CT: Ca2+ = 0.   GERD (gastroesophageal reflux disease)    Kidney stones    Migraine    Neck pain, chronic    Osteoporosis    Primary hypertension    Spinal stenosis     Current Outpatient Medications  Medication Sig Dispense Refill   atorvastatin  (LIPITOR) 40 MG tablet Take 1 tablet (40 mg total) by mouth daily. 30 tablet 0   buPROPion (WELLBUTRIN XL) 150 MG 24 hr tablet Take 1 tablet by mouth daily.     celecoxib (CELEBREX) 100 MG capsule Take 100 mg by mouth daily.     famotidine (PEPCID) 20 MG tablet Take 20 mg by mouth 2 (two) times daily as needed (acid reflux).     HYDROcodone -acetaminophen  (NORCO) 7.5-325 MG tablet Take 1 tablet by mouth 3 (three) times daily as needed.     lidocaine  (LIDODERM ) 5 % Place onto the skin.     omeprazole  (PRILOSEC) 40 MG capsule Take 1 capsule (40 mg total) by mouth 2 (two) times daily. 180 capsule 1   pregabalin (LYRICA) 100 MG capsule Take 100 mg by mouth 2 (two) times daily.     sertraline  (ZOLOFT ) 100 MG tablet Take 150 mg by mouth daily.      No current facility-administered medications for this visit.    Allergies  Allergen Reactions   Ibuprofen Other (See Comments)    Causes heartburn, burning in her throat.   Penicillins Rash    Family History  Problem Relation Age of Onset   Heart failure Mother    Stomach cancer Father 21   Ovarian cancer Maternal Aunt    Ovarian cancer Maternal Aunt    Cancer Maternal Aunt        unk type; female origin   Lung cancer Paternal Uncle    Stomach cancer Paternal Grandmother        d. 55   Stomach cancer Other    Colon cancer Cousin        2 mat cousins; both d. 59s   Breast cancer Cousin        maternal cousin dx late 74s    Social History   Socioeconomic History   Marital status: Married    Spouse name: Not on file   Number of children: Not on file   Years of education: Not on file   Highest education level: Some college, no degree  Occupational History   Not on file  Tobacco Use  Smoking status: Never   Smokeless tobacco: Never  Vaping Use   Vaping status: Never Used  Substance and Sexual Activity   Alcohol use: Never    Comment: occ   Drug use: Never   Sexual activity: Yes  Other Topics Concern   Not on file  Social History Narrative   Not on file   Social Drivers of Health   Financial Resource Strain: Low Risk (10/09/2021)   Received from [facility]   Overall Financial Resource Strain (CARDIA)    Difficulty of Paying Living Expenses: Not hard at all  Food Insecurity: No Food Insecurity (09/03/2023)   Hunger Vital Sign    Worried About Running Out of Food in the Last Year: Never true    Ran Out of Food in the Last Year: Never true  Transportation Needs: No Transportation Needs (09/03/2023)   PRAPARE - Administrator, Civil Service (Medical): No    Lack of Transportation (Non-Medical): No  Physical Activity: Inactive (10/09/2021)   Received from [facility]   Exercise Vital Sign    On average, how many days per week do you engage  in moderate to strenuous exercise (like a brisk walk)?: 0 days    On average, how many minutes do you engage in exercise at this level?: 0 min  Stress: No Stress Concern Present (10/09/2021)   Received from [facility] - Occupational Stress Questionnaire    Feeling of Stress : Not at all  Social Connections: Socially Integrated (09/03/2023)   Social Connection and Isolation Panel    Frequency of Communication with Friends and Family: More than three times a week    Frequency of Social Gatherings with Friends and Family: More than three times a week    Attends Religious Services: 1 to 4 times per year    Active Member of [employer] or Organizations: Yes    Attends Banker Meetings: More than 4 times per year    Marital Status: Married  Catering Manager Violence: Not At Risk (09/03/2023)   Humiliation, Afraid, Rape, and Kick questionnaire    Fear of Current or Ex-Partner: No    Emotionally Abused: No    Physically Abused: No    Sexually Abused: No     Constitutional: Patient reports intermittent headaches.  Denies fever, malaise, fatigue, or abrupt weight changes.  HEENT: Denies eye pain, eye redness, ear pain, ringing in the ears, wax buildup, runny nose, nasal congestion, bloody nose, or sore throat. Respiratory: Denies difficulty breathing, shortness of breath, cough or sputum production.   Cardiovascular: Denies chest pain, chest tightness, palpitations or swelling in the hands or feet.  Gastrointestinal: Pt reports reflux. Denies abdominal pain, bloating, constipation, diarrhea or blood in the stool.  GU: Denies urgency, frequency, pain with urination, burning sensation, blood in urine, odor or discharge. Musculoskeletal: Patient reports chronic back pain, right shoulder pain.  Denies decrease in range of motion, difficulty with gait, or joint swelling.  Skin: Denies redness, rashes, lesions or ulcercations.  Neurological: Denies  dizziness, difficulty with memory, difficulty with speech or problems with balance and coordination.  Psych: Patient has a history of depression.  Denies anxiety, SI/HI.  No other specific complaints in a complete review of systems (except as listed in HPI above).      Objective:    BP 124/74 (BP Location: Left Arm, Patient Position: Sitting, Cuff Size: Large)   Ht 5' 3 (1.6 m)  Wt 205 lb 6.4 oz (93.2 kg)   BMI 36.38 kg/m   Wt Readings from Last 3 Encounters:  11/26/23 206 lb (93.4 kg)  10/28/23 204 lb (92.5 kg)  09/24/23 208 lb 9.6 oz (94.6 kg)    General: Appears her stated age, obese, in NAD. Skin: Warm, dry and intact. No rashes, lesions or ulcerations noted. HEENT: Head: normal shape and size; Eyes: sclera white, no icterus, conjunctiva pink, PERRLA and EOMs intact;  Neck:  Neck supple, trachea midline. No masses, lumps or thyromegaly present.  Cardiovascular: Normal rate and rhythm. S1,S2 noted.  No murmur, rubs or gallops noted. No JVD or BLE edema. No carotid bruits noted. Pulmonary/Chest: Normal effort and positive vesicular breath sounds. No respiratory distress. No wheezes, rales or ronchi noted.  Abdomen: Normal bowel sounds. Musculoskeletal: Strength 4/5 BUE, 5/5 BLE.  No difficulty with gait.  Neurological: Alert and oriented. Cranial nerves II-XII grossly intact. Coordination normal.  Psychiatric: Mood and affect normal. Behavior is normal. Judgment and thought content normal.    BMET    Component Value Date/Time   NA 139 10/28/2023 1434   K 4.0 10/28/2023 1434   CL 106 10/28/2023 1434   CO2 23 10/28/2023 1434   GLUCOSE 98 10/28/2023 1434   BUN 20 10/28/2023 1434   CREATININE 1.07 (H) 10/28/2023 1434   CALCIUM  9.6 10/28/2023 1434   GFRNONAA >60 09/04/2023 0516   GFRNONAA 71 11/20/2015 1638   GFRAA >60 03/05/2019 1553   GFRAA 81 11/20/2015 1638    Lipid Panel     Component Value Date/Time   CHOL 145 09/04/2023 0516   TRIG 131 09/04/2023 0516    HDL 42 09/04/2023 0516   CHOLHDL 3.5 09/04/2023 0516   VLDL 26 09/04/2023 0516   LDLCALC 77 09/04/2023 0516    CBC    Component Value Date/Time   WBC 7.1 10/28/2023 1434   RBC 4.27 10/28/2023 1434   HGB 14.1 10/28/2023 1434   HCT 41.8 10/28/2023 1434   PLT 278 10/28/2023 1434   MCV 97.9 10/28/2023 1434   MCH 33.0 10/28/2023 1434   MCHC 33.7 10/28/2023 1434   RDW 12.3 10/28/2023 1434   LYMPHSABS 0.4 (L) 09/02/2023 2101   MONOABS 0.1 09/02/2023 2101   EOSABS 0.1 09/02/2023 2101   BASOSABS 0.0 09/02/2023 2101    Hgb A1C Lab Results  Component Value Date   HGBA1C 5.8 (H) 09/24/2023            Assessment & Plan:   Preventative health maintenance:  Flu shot UTD She declines tetanus for financial reasons, advised her if she gets bit or cut to go get this done Encouraged her to get her COVID booster Pneumovax and Prevnar UTD Zostavax UTD Discussed Shingrix vaccine, she will check coverage with her insurance company and schedule visit if she would like to have this done She no longer needs to screen for cervical cancer Mammogram UTD Bone density UTD Colon screening UTD Encouraged her to consume a balanced diet and exercise regimen Advised her to see an eye doctor and dentist annually We will check CBC, c-Met, lipid, A1c and hep C today  RTC in 6 months for follow-up of chronic conditions Angeline Laura, NP

## 2024-03-28 NOTE — Assessment & Plan Note (Signed)
 Encouraged diet and exercise for weight loss ?

## 2024-03-29 ENCOUNTER — Ambulatory Visit: Payer: Self-pay | Admitting: Internal Medicine

## 2024-03-29 LAB — COMPREHENSIVE METABOLIC PANEL WITH GFR
AG Ratio: 1.8 (calc) (ref 1.0–2.5)
ALT: 15 U/L (ref 6–29)
AST: 17 U/L (ref 10–35)
Albumin: 4.8 g/dL (ref 3.6–5.1)
Alkaline phosphatase (APISO): 123 U/L (ref 37–153)
BUN: 18 mg/dL (ref 7–25)
CO2: 28 mmol/L (ref 20–32)
Calcium: 9.7 mg/dL (ref 8.6–10.4)
Chloride: 101 mmol/L (ref 98–110)
Creat: 0.99 mg/dL (ref 0.60–1.00)
Globulin: 2.6 g/dL (ref 1.9–3.7)
Glucose, Bld: 87 mg/dL (ref 65–99)
Potassium: 4.4 mmol/L (ref 3.5–5.3)
Sodium: 138 mmol/L (ref 135–146)
Total Bilirubin: 0.4 mg/dL (ref 0.2–1.2)
Total Protein: 7.4 g/dL (ref 6.1–8.1)
eGFR: 61 mL/min/1.73m2 (ref >=60)

## 2024-03-29 LAB — CBC
HCT: 42.8 % (ref 35.9–46.0)
Hemoglobin: 14.4 g/dL (ref 11.7–15.5)
MCH: 32.7 pg (ref 27.0–33.0)
MCHC: 33.6 g/dL (ref 31.6–35.4)
MCV: 97.3 fL (ref 81.4–101.7)
MPV: 11.4 fL (ref 7.5–12.5)
Platelets: 298 Thousand/uL (ref 140–400)
RBC: 4.4 Million/uL (ref 3.80–5.10)
RDW: 11.9 % (ref 11.0–15.0)
WBC: 7.8 Thousand/uL (ref 3.8–10.8)

## 2024-03-29 LAB — LIPID PANEL
Cholesterol: 239 mg/dL — ABNORMAL HIGH (ref <200)
HDL: 60 mg/dL (ref >=50)
LDL Cholesterol (Calc): 138 mg/dL — ABNORMAL HIGH
Non-HDL Cholesterol (Calc): 179 mg/dL — ABNORMAL HIGH (ref <130)
Total CHOL/HDL Ratio: 4 (calc) (ref <5.0)
Triglycerides: 264 mg/dL — ABNORMAL HIGH (ref <150)

## 2024-03-29 LAB — HEMOGLOBIN A1C
Hgb A1c MFr Bld: 5.6 % (ref <5.7)
Mean Plasma Glucose: 114 mg/dL
eAG (mmol/L): 6.3 mmol/L

## 2024-03-29 LAB — HEPATITIS C ANTIBODY: Hepatitis C Ab: NONREACTIVE

## 2024-03-29 MED ORDER — ATORVASTATIN CALCIUM 40 MG PO TABS: 40.0000 mg | ORAL_TABLET | Freq: Every day | ORAL | 0 refills | Status: DC

## 2024-04-24 ENCOUNTER — Other Ambulatory Visit: Payer: Self-pay

## 2024-04-24 MED ORDER — SERTRALINE HCL 100 MG PO TABS: 150.0000 mg | ORAL_TABLET | Freq: Every day | ORAL | 0 refills | Status: AC

## 2024-05-01 ENCOUNTER — Other Ambulatory Visit: Payer: Self-pay | Admitting: Internal Medicine

## 2024-05-02 NOTE — Telephone Encounter (Signed)
 Requested medications are due for refill today.  yes  Requested medications are on the active medications list.  yes  Last refill. 03/29/2024 #30 0 rf  Future visit scheduled.   yes  Notes to clinic.  Rx expired 04/28/2024.    Requested Prescriptions  Pending Prescriptions Disp Refills   atorvastatin  (LIPITOR) 40 MG tablet [Pharmacy Med Name: ATORVASTATIN  40 MG TABLET] 30 tablet 0    Sig: Take 1 tablet (40 mg total) by mouth daily.     Cardiovascular:  Antilipid - Statins Failed - 05/02/2024  5:15 PM      Failed - Lipid Panel in normal range within the last 12 months    Cholesterol  Date Value Ref Range Status  03/28/2024 239 (H) <200 mg/dL Final   LDL Cholesterol (Calc)  Date Value Ref Range Status  03/28/2024 138 (H) mg/dL (calc) Final    Comment:    Reference range: <100 . Desirable range <100 mg/dL for primary prevention;   <70 mg/dL for patients with CHD or diabetic patients  with > or = 2 CHD risk factors. [facility] LDL-C is now calculated using the Martin-Hopkins  calculation, which is a validated novel method providing  better accuracy than the Friedewald equation in the  estimation of LDL-C.  Gladis APPLETHWAITE et al. SANDREA. 7986;689(80): 2061-2068  (http://education.[facility].com/faq/FAQ164)    HDL  Date Value Ref Range Status  03/28/2024 60 > OR = 50 mg/dL Final   Triglycerides  Date Value Ref Range Status  03/28/2024 264 (H) <150 mg/dL Final    Comment:    . If a non-fasting specimen was collected, consider repeat triglyceride testing on a fasting specimen if clinically indicated.  Veatrice et al. J. of Clin. Lipidol. 2015;9:129-169. [facility]ABRA          Passed - Patient is not pregnant      Passed - Valid encounter within last 12 months    Recent Outpatient Visits           1 month ago Encounter for general adult medical examination with abnormal findings   [facility] [place], Angeline ORN, NP   6 months ago Other fatigue   [place]  [facility] [place], Angeline ORN, NP   7 months ago Prediabetes   [facility] [place], Angeline ORN, [place]

## 2024-05-23 ENCOUNTER — Encounter: Payer: Self-pay | Admitting: Internal Medicine

## 2024-05-23 ENCOUNTER — Ambulatory Visit (INDEPENDENT_AMBULATORY_CARE_PROVIDER_SITE_OTHER): Admitting: Internal Medicine

## 2024-05-23 VITALS — BP 122/82 | Ht 63.0 in | Wt 208.0 lb

## 2024-05-23 DIAGNOSIS — M5442 Lumbago with sciatica, left side: Secondary | ICD-10-CM | POA: Diagnosis not present

## 2024-05-23 MED ORDER — METHOCARBAMOL 500 MG PO TABS: 500.0000 mg | ORAL_TABLET | Freq: Three times a day (TID) | ORAL | 0 refills | Status: AC | PRN

## 2024-05-23 MED ORDER — PREDNISONE 10 MG PO TABS: ORAL_TABLET | ORAL | 0 refills | Status: AC

## 2024-05-23 NOTE — Progress Notes (Signed)
 "  Subjective:    Patient ID: Robin Ochoa, female    DOB: [birth year], 69 y.o.   MRN: [id number]  HPI   Discussed the use of AI scribe software for clinical note transcription with the patient, who gave verbal consent to proceed.  Robin Ochoa is a 69 year old female who presents with left-sided back pain.  She has been experiencing severe left-sided pain for the past two to three days. The pain radiates into her left hip, posterior left leg and vaginal area. The pain began around Saturday and is temporarily relieved by using a heating pad. No urinary symptoms such as urgency, frequency, dysuria,  burning during urination, although she initially suspected a urinary tract infection and took ASO over the weekend without relief.  She has a history of a cyst in the vaginal area  but has not felt any lump or mass. Currently, she does not feel any mass or cyst under the skin. No vaginal discharge or abnormal bleeding is present. She mentions discomfort with wearing tight clothing due to the pain.  Her past medical history includes a spinal fusion, performed nearly three years ago in August 2023. She had moderate to severe stenosis at L4, L5, as noted in an MRI of her lumbar spine from 2020. The current pain feels similar to the pain she had before the spinal fusion, which was previously localized to her back and radiated down her leg.  She is currently taking Celebrex daily and Lyrica 100 mg twice a Youse. She reports that she does not have any muscle relaxers at home.       Review of Systems   Past Medical History:  Diagnosis Date   Abnormal vaginal Pap smear    Allergy    Colon polyps    Depression    Diastolic dysfunction    a. 01/2022 Echo: EF of 60 to 65% with no rwma, gr1 DD, normal RV fxn, and no evidence of MR.   Dyspnea    a. 01/2022 Echo: Nl LV/RV fxn w/o valvular dzs. GrI DD; b. 01/2022 Cardiac CT: Ca2+ = 0.   GERD (gastroesophageal reflux disease)    Kidney stones    Migraine     Neck pain, chronic    Osteoporosis    Primary hypertension    Spinal stenosis     Current Outpatient Medications  Medication Sig Dispense Refill   atorvastatin  (LIPITOR) 40 MG tablet Take 1 tablet (40 mg total) by mouth daily. 30 tablet 0   buPROPion (WELLBUTRIN XL) 150 MG 24 hr tablet Take 1 tablet by mouth daily.     celecoxib (CELEBREX) 100 MG capsule Take 100 mg by mouth daily.     famotidine  (PEPCID ) 20 MG tablet Take 1 tablet (20 mg total) by mouth 2 (two) times daily as needed (acid reflux). 180 tablet 1   HYDROcodone -acetaminophen  (NORCO) 7.5-325 MG tablet Take 1 tablet by mouth 3 (three) times daily as needed.     lidocaine  (LIDODERM ) 5 % Place onto the skin.     naloxone (NARCAN) nasal spray 4 mg/0.1 mL Place 4 mg into the nose.     omeprazole  (PRILOSEC) 40 MG capsule Take 1 capsule (40 mg total) by mouth 2 (two) times daily. 180 capsule 1   pregabalin (LYRICA) 100 MG capsule Take 100 mg by mouth 2 (two) times daily.     sertraline  (ZOLOFT ) 100 MG tablet Take 1.5 tablets (150 mg total) by mouth daily. 135 tablet 0   No  current facility-administered medications for this visit.    Allergies  Allergen Reactions   Ibuprofen Other (See Comments)    Causes heartburn, burning in her throat.   Penicillins Rash    Family History  Problem Relation Age of Onset   Heart failure Mother    Stomach cancer Father 66   Ovarian cancer Maternal Aunt    Ovarian cancer Maternal Aunt    Cancer Maternal Aunt        unk type; female origin   Lung cancer Paternal Uncle    Stomach cancer Paternal Grandmother        d. 70   Stomach cancer Other    Colon cancer Cousin        2 mat cousins; both d. 42s   Breast cancer Cousin        maternal cousin dx late 21s    Social History   Socioeconomic History   Marital status: Married    Spouse name: Not on file   Number of children: Not on file   Years of education: Not on file   Highest education level: Some college, no degree   Occupational History   Not on file  Tobacco Use   Smoking status: Never   Smokeless tobacco: Never  Vaping Use   Vaping status: Never Used  Substance and Sexual Activity   Alcohol use: Never    Comment: occ   Drug use: Never   Sexual activity: Yes  Other Topics Concern   Not on file  Social History Narrative   Not on file   Social Drivers of Health   Tobacco Use: Low Risk (03/28/2024)   Patient History    Smoking Tobacco Use: Never    Smokeless Tobacco Use: Never    Passive Exposure: Not on file  Financial Resource Strain: Low Risk (10/09/2021)   Received from [facility]   Overall Financial Resource Strain (CARDIA)    Difficulty of Paying Living Expenses: Not hard at all  Food Insecurity: No Food Insecurity (09/03/2023)   Hunger Vital Sign    Worried About Running Out of Food in the Last Year: Never true    Ran Out of Food in the Last Year: Never true  Transportation Needs: No Transportation Needs (09/03/2023)   PRAPARE - Administrator, Civil Service (Medical): No    Lack of Transportation (Non-Medical): No  Physical Activity: Inactive (10/09/2021)   Received from [facility]   Exercise Vital Sign    On average, how many days per week do you engage in moderate to strenuous exercise (like a brisk walk)?: 0 days    On average, how many minutes do you engage in exercise at this level?: 0 min  Stress: No Stress Concern Present (10/09/2021)   Received from [facility] - Occupational Stress Questionnaire    Feeling of Stress : Not at all  Social Connections: Socially Integrated (09/03/2023)   Social Connection and Isolation Panel    Frequency of Communication with Friends and Family: More than three times a week    Frequency of Social Gatherings with Friends and Family: More than three times a week    Attends Religious Services: 1 to 4 times per year    Active Member of [employer] or Organizations: Yes    Attends Occupational Hygienist Meetings: More than 4 times per year    Marital Status: Married  Catering Manager Violence: Not At Risk (09/03/2023)  Humiliation, Afraid, Rape, and Kick questionnaire    Fear of Current or Ex-Partner: No    Emotionally Abused: No    Physically Abused: No    Sexually Abused: No  Depression (PHQ2-9): Low Risk (03/28/2024)   Depression (PHQ2-9)    PHQ-2 Score: 0  Alcohol Screen: Not on file  Housing: Low Risk (09/03/2023)   Housing Stability Vital Sign    Unable to Pay for Housing in the Last Year: No    Number of Times Moved in the Last Year: 0    Homeless in the Last Year: No  Utilities: Not At Risk (09/03/2023)   [facility] Utilities    Threatened with loss of utilities: No  Health Literacy: Low Risk (10/09/2021)   Received from [facility]    How often do you need to have someone help you when you read instructions, pamphlets, or other written material from your doctor or pharmacy?: Never     Constitutional: Patient reports intermittent headaches.  Denies fever, malaise, fatigue, or abrupt weight changes.  Respiratory: Denies difficulty breathing, shortness of breath, cough or sputum production.   Cardiovascular: Denies chest pain, chest tightness, palpitations or swelling in the hands or feet.  Gastrointestinal: Pt reports reflux. Denies abdominal pain, bloating, constipation, diarrhea or blood in the stool.  GU: Pt reports vaginal pain. Denies urgency, frequency, pain with urination, burning sensation, blood in urine, odor or discharge. Musculoskeletal: Patient reports chronic back pain, right shoulder pain.  Denies decrease in range of motion, difficulty with gait, or joint swelling.  Skin: Denies redness, rashes, lesions or ulcercations.  Neurological: Pt report paresthesia of left leg. Denies dizziness, difficulty with memory, difficulty with speech or problems with balance and coordination.   No other specific complaints in a complete review of  systems (except as listed in HPI above).      Objective:    BP 122/82 (BP Location: Right Arm, Patient Position: Sitting, Cuff Size: Large)   Ht 5' 3 (1.6 m)   Wt 208 lb (94.3 kg)   BMI 36.85 kg/m    Wt Readings from Last 3 Encounters:  03/28/24 205 lb 6.4 oz (93.2 kg)  11/26/23 206 lb (93.4 kg)  10/28/23 204 lb (92.5 kg)    General: Appears her stated age, obese, in NAD. HEENT: Head: normal shape and size; Eyes: sclera white, no icterus, conjunctiva pink, PERRLA and EOMs intact;  Cardiovascular: Normal rate and rhythm. S1,S2 noted.  No murmur, rubs or gallops noted.  Pulmonary/Chest: Normal effort and positive vesicular breath sounds. No respiratory distress. No wheezes, rales or ronchi noted.  Pelvic: Normal female anatomy.  No evidence of cyst, lump or mass in the left labial area. Musculoskeletal: Decreased flexion and extension of the spine secondary to pain.  Normal rotation of spine.  Pain with palpation over the lumbar spine and left SI joint.  Strength 4/5 LLE, 5/5 RLE.  Limping gait.  Neurological: Alert and oriented.  Positive SLR on the left at 45 degrees.   BMET    Component Value Date/Time   NA 138 03/28/2024 1416   K 4.4 03/28/2024 1416   CL 101 03/28/2024 1416   CO2 28 03/28/2024 1416   GLUCOSE 87 03/28/2024 1416   BUN 18 03/28/2024 1416   CREATININE 0.99 03/28/2024 1416   CALCIUM  9.7 03/28/2024 1416   GFRNONAA >60 09/04/2023 0516   GFRNONAA 71 11/20/2015 1638   GFRAA >60 03/05/2019 1553   GFRAA 81 11/20/2015 1638  Lipid Panel     Component Value Date/Time   CHOL 239 (H) 03/28/2024 1416   TRIG 264 (H) 03/28/2024 1416   HDL 60 03/28/2024 1416   CHOLHDL 4.0 03/28/2024 1416   VLDL 26 09/04/2023 0516   LDLCALC 138 (H) 03/28/2024 1416    CBC    Component Value Date/Time   WBC 7.8 03/28/2024 1416   RBC 4.40 03/28/2024 1416   HGB 14.4 03/28/2024 1416   HCT 42.8 03/28/2024 1416   PLT 298 03/28/2024 1416   MCV 97.3 03/28/2024 1416   MCH 32.7  03/28/2024 1416   MCHC 33.6 03/28/2024 1416   RDW 11.9 03/28/2024 1416   LYMPHSABS 0.4 (L) 09/02/2023 2101   MONOABS 0.1 09/02/2023 2101   EOSABS 0.1 09/02/2023 2101   BASOSABS 0.0 09/02/2023 2101    Hgb A1C Lab Results  Component Value Date   HGBA1C 5.6 03/28/2024            Assessment & Plan:   Assessment and Plan    Lumbago with left-sided sciatica Acute exacerbation of left-sided sciatica, likely due to lumbar spinal stenosis and previous spinal fusion. No new neurological deficits. Differential includes pinched nerve or arthritis in the left hip. - Held Celebrex temporarily. - Prescribed prednisone  10 mg 9 Roh taper. - Continue Lyrica 100 mg twice a [place]. - Prescribed methocarbamol  500 mg every 8 hours digitation caution given. - Advised use of heat and ice on the low back. - Recommended contacting back specialist if symptoms do not improve.  Lumbar spinal stenosis, status post spinal fusion Moderate to severe stenosis at L4-L5 with previous spinal fusion at S1 and L5. Current symptoms may relate to prior stenosis and fusion. - Recommended contacting back specialist for further evaluation and management.        RTC in 5 months for follow-up of chronic conditions Angeline Laura, NP  "

## 2024-05-23 NOTE — Patient Instructions (Signed)
 Sciatica  Sciatica is pain, weakness, tingling, or loss of feeling (numbness) along the sciatic nerve. The sciatic nerve starts in the lower back and goes down the back of each leg. Sciatica usually affects one side of the body. Sciatica usually goes away on its own or with treatment. Sometimes, sciatica may come back. What are the causes? This condition happens when the sciatic nerve is pinched or has pressure put on it. This may be caused by: A disk in between the bones of the spine bulging out too far (herniated disk). Changes in the spinal disks due to aging. A condition that affects a muscle in the butt. Extra bone growth near the sciatic nerve. A break (fracture) of the area between your hip bones (pelvis). Pregnancy. Tumor. This is rare. What increases the risk? You are more likely to develop this condition if you: Play sports that put pressure or stress on the spine. Have poor strength and ease of movement (flexibility). Have had a back injury or back surgery. Sit for long periods of time. Do activities that involve bending or lifting over and over again. Are very overweight (obese). What are the signs or symptoms? Symptoms can vary from mild to very bad. They may include: Any of these problems in the lower back, leg, hip, or butt: Mild tingling, loss of feeling, or dull aches. A burning feeling. Sharp pains. Loss of feeling in the back of the calf or the sole of the foot. Leg weakness. Very bad back pain that makes it hard to move. These symptoms may get worse when you cough, sneeze, or laugh. They may also get worse when you sit or stand for long periods of time. How is this treated? This condition often gets better without any treatment. However, treatment may include: Changing or cutting back on physical activity when you have pain. Exercising, including strengthening and stretching. Putting ice or heat on the affected area. Shots of medicines to relieve pain and  swelling or to relax your muscles. Surgery. Follow these instructions at home: Medicines Take over-the-counter and prescription medicines only as told by your doctor. Ask your doctor if you should avoid driving or using machines while you are taking your medicine. Managing pain     If told, put ice on the affected area. To do this: Put ice in a plastic bag. Place a towel between your skin and the bag. Leave the ice on for 20 minutes, 2-3 times a day. If your skin turns bright red, take off the ice right away to prevent skin damage. The risk of skin damage is higher if you cannot feel pain, heat, or cold. If told, put heat on the affected area. Do this as often as told by your doctor. Use the heat source that your doctor tells you to use, such as a moist heat pack or a heating pad. Place a towel between your skin and the heat source. Leave the heat on for 20-30 minutes. If your skin turns bright red, take off the heat right away to prevent burns. The risk of burns is higher if you cannot feel pain, heat, or cold. Activity  Return to your normal activities when your doctor says that it is safe. Avoid activities that make your symptoms worse. Take short rests during the day. When you rest for a long time, do some physical activity or stretching between periods of rest. Avoid sitting for a long time without moving. Get up and move around at least one time each  hour. Do exercises and stretches as told by your doctor. Do not lift anything that is heavier than 10 lb (4.5 kg). Avoid lifting heavy things even when you do not have symptoms. Avoid lifting heavy things over and over. When you lift objects, always lift in a way that is safe for your body. To do this, you should: Bend your knees. Keep the object close to your body. Avoid twisting. General instructions Stay at a healthy weight. Wear comfortable shoes that support your feet. Avoid wearing high heels. Avoid sleeping on a mattress  that is too soft or too hard. You might have less pain if you sleep on a mattress that is firm enough to support your back. Contact a doctor if: Your pain is not controlled by medicine. Your pain does not get better. Your pain gets worse. Your pain lasts longer than 4 weeks. You lose weight without trying. Get help right away if: You cannot control when you pee (urinate) or poop (have a bowel movement). You have weakness in any of these areas and it gets worse: Lower back. The area between your hip bones. Butt. Legs. You have redness or swelling of your back. You have a burning feeling when you pee. Summary Sciatica is pain, weakness, tingling, or loss of feeling (numbness) along the sciatic nerve. This may include the lower back, legs, hips, and butt. This condition happens when the sciatic nerve is pinched or has pressure put on it. Treatment often includes rest, exercise, medicines, and putting ice or heat on the affected area. This information is not intended to replace advice given to you by your health care provider. Make sure you discuss any questions you have with your health care provider. Document Revised: 07/21/2021 Document Reviewed: 07/21/2021 Elsevier Patient Education  2024 [employer].

## 2024-09-27 ENCOUNTER — Ambulatory Visit: Admitting: Internal Medicine
